# Patient Record
Sex: Female | Born: 1985
Health system: Southern US, Community
[De-identification: ages and names within clinical notes are randomized; demographics above are authoritative.]

## PROBLEM LIST (undated history)

## (undated) DIAGNOSIS — F419 Anxiety disorder, unspecified: Secondary | ICD-10-CM

## (undated) DIAGNOSIS — I10 Essential (primary) hypertension: Secondary | ICD-10-CM

## (undated) DIAGNOSIS — T8859XA Other complications of anesthesia, initial encounter: Secondary | ICD-10-CM

## (undated) HISTORY — DX: Other complications of anesthesia, initial encounter: T88.59XA

## (undated) HISTORY — DX: Essential (primary) hypertension: I10

## (undated) HISTORY — PX: TONSILLECTOMY: SUR1361

## (undated) HISTORY — DX: Anxiety disorder, unspecified: F41.9

---

## 2019-02-04 NOTE — L&D Delivery Note (Signed)
Delivery Note:   G2P1001 at [redacted]w[redacted]d  Admitting diagnosis: Encounter for induction of labor [Z34.90] Risks: CHTN Onset of labor: 1138am IOL/Augmentation: AROM and Pitocin ROM: at 1443 for clear fluid   Complete dilation at 10/27/2019  1600 Onset of pushing at 1600 FHR second stage Category   Analgesia /Anesthesia intrapartum:Epidural  Pushing in litotomy position with CNM and L&D staff support, FOB and mother present for birth and supportive.  Delivery of a Live born female  Birth Weight:  pending APGAR: 8, 9  Newborn Delivery   Birth date/time: 10/27/2019 16:08:00 Delivery type: Vaginal, Spontaneous      in cephalic presentation, position LOA to LOT.  APGAR:1 min-8 , 5 min-9  Nuchal Cord: No  Cord double clamped after cessation of pulsation, cut by FOB.  Collection of cord blood for typing completed. Cord blood donation-None  Arterial cord blood sample-No    Placenta delivered-Spontaneous  with 3 vessels . Uterotonics: Pitocin  Placenta to hospital disposal. Uterine tone firm bleeding stable  1st degree;Perineal  deep left sulcus, right vaginal laceration identified. 2.0 vicryl rapide used to repair deep sulcus laceration. Upon completion of repair, bleeding noted from under repair. Initial stitch removed. Deep interrupted stitch placed by Dr. Ernestina Penna, and then I repaired it in standard fashion. 1 additional deep interrupted stitch placed by me with good hemostasis. Right vaginal laceration repaired in standard fashion with 2.0 vicryl rapide. 1st degree perineal laceration repaired with 3.0 vicryl rapide with subcuticular closure.  Hemostasis was noted. No evidence of hematoma, but ecchymosis of vaginal floor noted.  Episiotomy:None  Local analgesia: 1% Lidocaine  Est. Blood Loss (mL):350.00   Complications: None   Mom to postpartum.  Baby to Couplet care / Skin to Skin Delivery Report:  Review the Delivery Report for details.     Signed: Karena Addison, CNM,  MSN 10/27/2019, 5:28 PM

## 2019-03-11 DIAGNOSIS — Z3689 Encounter for other specified antenatal screening: Secondary | ICD-10-CM | POA: Diagnosis not present

## 2019-03-11 DIAGNOSIS — Z3200 Encounter for pregnancy test, result unknown: Secondary | ICD-10-CM | POA: Diagnosis not present

## 2019-03-25 DIAGNOSIS — Z3201 Encounter for pregnancy test, result positive: Secondary | ICD-10-CM | POA: Diagnosis not present

## 2019-04-08 DIAGNOSIS — R339 Retention of urine, unspecified: Secondary | ICD-10-CM | POA: Diagnosis not present

## 2019-04-08 DIAGNOSIS — I10 Essential (primary) hypertension: Secondary | ICD-10-CM | POA: Diagnosis not present

## 2019-04-08 DIAGNOSIS — R300 Dysuria: Secondary | ICD-10-CM | POA: Diagnosis not present

## 2019-04-15 DIAGNOSIS — O169 Unspecified maternal hypertension, unspecified trimester: Secondary | ICD-10-CM | POA: Diagnosis not present

## 2019-04-15 DIAGNOSIS — Z36 Encounter for antenatal screening for chromosomal anomalies: Secondary | ICD-10-CM | POA: Diagnosis not present

## 2019-04-15 DIAGNOSIS — Z1151 Encounter for screening for human papillomavirus (HPV): Secondary | ICD-10-CM | POA: Diagnosis not present

## 2019-04-15 DIAGNOSIS — O99210 Obesity complicating pregnancy, unspecified trimester: Secondary | ICD-10-CM | POA: Diagnosis not present

## 2019-04-15 DIAGNOSIS — Z124 Encounter for screening for malignant neoplasm of cervix: Secondary | ICD-10-CM | POA: Diagnosis not present

## 2019-04-15 DIAGNOSIS — Z3A10 10 weeks gestation of pregnancy: Secondary | ICD-10-CM | POA: Diagnosis not present

## 2019-04-15 DIAGNOSIS — O09521 Supervision of elderly multigravida, first trimester: Secondary | ICD-10-CM | POA: Diagnosis not present

## 2019-04-15 LAB — OB RESULTS CONSOLE GC/CHLAMYDIA
Chlamydia: NEGATIVE
Gonorrhea: NEGATIVE

## 2019-04-15 LAB — OB RESULTS CONSOLE HEPATITIS B SURFACE ANTIGEN: Hepatitis B Surface Ag: NEGATIVE

## 2019-04-15 LAB — OB RESULTS CONSOLE ABO/RH: RH Type: POSITIVE

## 2019-04-15 LAB — OB RESULTS CONSOLE HIV ANTIBODY (ROUTINE TESTING): HIV: NONREACTIVE

## 2019-04-15 LAB — OB RESULTS CONSOLE RUBELLA ANTIBODY, IGM: Rubella: IMMUNE

## 2019-04-15 LAB — OB RESULTS CONSOLE ANTIBODY SCREEN: Antibody Screen: NEGATIVE

## 2019-04-15 LAB — OB RESULTS CONSOLE RPR: RPR: NONREACTIVE

## 2019-04-22 DIAGNOSIS — R339 Retention of urine, unspecified: Secondary | ICD-10-CM | POA: Diagnosis not present

## 2019-04-22 DIAGNOSIS — Z3A11 11 weeks gestation of pregnancy: Secondary | ICD-10-CM | POA: Diagnosis not present

## 2019-04-22 DIAGNOSIS — O09521 Supervision of elderly multigravida, first trimester: Secondary | ICD-10-CM | POA: Diagnosis not present

## 2019-05-16 DIAGNOSIS — Z361 Encounter for antenatal screening for raised alphafetoprotein level: Secondary | ICD-10-CM | POA: Diagnosis not present

## 2019-05-16 DIAGNOSIS — O162 Unspecified maternal hypertension, second trimester: Secondary | ICD-10-CM | POA: Diagnosis not present

## 2019-05-16 DIAGNOSIS — O169 Unspecified maternal hypertension, unspecified trimester: Secondary | ICD-10-CM | POA: Diagnosis not present

## 2019-05-16 DIAGNOSIS — O09522 Supervision of elderly multigravida, second trimester: Secondary | ICD-10-CM | POA: Diagnosis not present

## 2019-05-16 DIAGNOSIS — Z3A15 15 weeks gestation of pregnancy: Secondary | ICD-10-CM | POA: Diagnosis not present

## 2019-06-17 DIAGNOSIS — O10012 Pre-existing essential hypertension complicating pregnancy, second trimester: Secondary | ICD-10-CM | POA: Diagnosis not present

## 2019-06-17 DIAGNOSIS — Z3A19 19 weeks gestation of pregnancy: Secondary | ICD-10-CM | POA: Diagnosis not present

## 2019-07-21 DIAGNOSIS — O10012 Pre-existing essential hypertension complicating pregnancy, second trimester: Secondary | ICD-10-CM | POA: Diagnosis not present

## 2019-07-21 DIAGNOSIS — R233 Spontaneous ecchymoses: Secondary | ICD-10-CM | POA: Diagnosis not present

## 2019-07-21 DIAGNOSIS — O358XX0 Maternal care for other (suspected) fetal abnormality and damage, not applicable or unspecified: Secondary | ICD-10-CM | POA: Diagnosis not present

## 2019-07-21 DIAGNOSIS — O283 Abnormal ultrasonic finding on antenatal screening of mother: Secondary | ICD-10-CM | POA: Diagnosis not present

## 2019-07-21 DIAGNOSIS — Z3A24 24 weeks gestation of pregnancy: Secondary | ICD-10-CM | POA: Diagnosis not present

## 2019-07-21 DIAGNOSIS — T148XXA Other injury of unspecified body region, initial encounter: Secondary | ICD-10-CM | POA: Diagnosis not present

## 2019-07-26 ENCOUNTER — Ambulatory Visit (HOSPITAL_BASED_OUTPATIENT_CLINIC_OR_DEPARTMENT_OTHER): Payer: BLUE CROSS/BLUE SHIELD | Admitting: Maternal & Fetal Medicine

## 2019-07-26 ENCOUNTER — Other Ambulatory Visit: Payer: Self-pay

## 2019-07-26 ENCOUNTER — Other Ambulatory Visit: Payer: Self-pay | Admitting: Obstetrics

## 2019-07-26 ENCOUNTER — Ambulatory Visit: Payer: BLUE CROSS/BLUE SHIELD | Attending: Obstetrics

## 2019-07-26 ENCOUNTER — Encounter (INDEPENDENT_AMBULATORY_CARE_PROVIDER_SITE_OTHER): Payer: Self-pay

## 2019-07-26 ENCOUNTER — Ambulatory Visit: Payer: BLUE CROSS/BLUE SHIELD | Admitting: *Deleted

## 2019-07-26 VITALS — BP 133/85 | HR 73 | Ht 66.0 in

## 2019-07-26 DIAGNOSIS — Z3A25 25 weeks gestation of pregnancy: Secondary | ICD-10-CM

## 2019-07-26 DIAGNOSIS — O10913 Unspecified pre-existing hypertension complicating pregnancy, third trimester: Secondary | ICD-10-CM | POA: Diagnosis not present

## 2019-07-26 DIAGNOSIS — O98519 Other viral diseases complicating pregnancy, unspecified trimester: Secondary | ICD-10-CM | POA: Insufficient documentation

## 2019-07-26 DIAGNOSIS — Z363 Encounter for antenatal screening for malformations: Secondary | ICD-10-CM

## 2019-07-26 DIAGNOSIS — O98513 Other viral diseases complicating pregnancy, third trimester: Secondary | ICD-10-CM

## 2019-07-26 DIAGNOSIS — R768 Other specified abnormal immunological findings in serum: Secondary | ICD-10-CM

## 2019-07-26 DIAGNOSIS — O358XX0 Maternal care for other (suspected) fetal abnormality and damage, not applicable or unspecified: Secondary | ICD-10-CM | POA: Diagnosis not present

## 2019-07-26 DIAGNOSIS — R894 Abnormal immunological findings in specimens from other organs, systems and tissues: Secondary | ICD-10-CM | POA: Diagnosis not present

## 2019-07-26 DIAGNOSIS — O353XX0 Maternal care for (suspected) damage to fetus from viral disease in mother, not applicable or unspecified: Secondary | ICD-10-CM

## 2019-07-26 DIAGNOSIS — IMO0002 Reserved for concepts with insufficient information to code with codable children: Secondary | ICD-10-CM

## 2019-07-26 DIAGNOSIS — O321XX0 Maternal care for breech presentation, not applicable or unspecified: Secondary | ICD-10-CM

## 2019-07-26 DIAGNOSIS — O09522 Supervision of elderly multigravida, second trimester: Secondary | ICD-10-CM

## 2019-07-26 DIAGNOSIS — O283 Abnormal ultrasonic finding on antenatal screening of mother: Secondary | ICD-10-CM | POA: Insufficient documentation

## 2019-07-26 DIAGNOSIS — B259 Cytomegaloviral disease, unspecified: Secondary | ICD-10-CM

## 2019-07-26 NOTE — Progress Notes (Signed)
MFM Consultation  Date of Service: 07/26/19 Requesting Provider: Aloha Gell, MD Reason for request: Antenatal CMV  Margaret Ryan is a 34 yo G2P1 at 51 w 1 d who is here in consultation at the request of Dr. Pamala Hurry regarding positive CMV IgG and IgM with echogenic bowel.  Ms. Duffy notes that the pregnancy has overall been normal without signs or symptoms of preterm labor or preeclampsia. She had some early first trimester bleeding that resolved. She had a low risk panorama and AFP. She declined CF screening due to cost.  On an outside exam echogenic bowel was observed. In addition the CMV IgG was elevated to 5.4 with an IgM of 90, both are significantly elevated above normal.  She reports that she has a young son who goes to day care and a month ago had a minor cold with red cheeks.   Vitals with BMI 07/26/2019  Height 5\' 6"   Systolic 740  Diastolic 85  Pulse 73    Recent CBC was normal/.  OB History  Gravida Para Term Preterm AB Living  2 1 1     1   SAB TAB Ectopic Multiple Live Births               # Outcome Date GA Lbr Len/2nd Weight Sex Delivery Anes PTL Lv  2 Current           1 Term      Vag-Spont      Past Medical History:  Diagnosis Date  . Anxiety   . Complication of anesthesia    She thinks itching after general anesth w/tonsillectomy  . Hypertension    Past Surgical History:  Procedure Laterality Date  . TONSILLECTOMY     Family History  Problem Relation Age of Onset  . Cancer Father   . Hypertension Brother    Social History   Socioeconomic History  . Marital status: Married    Spouse name: Not on file  . Number of children: Not on file  . Years of education: Not on file  . Highest education level: Not on file  Occupational History  . Not on file  Tobacco Use  . Smoking status: Former Research scientist (life sciences)  . Smokeless tobacco: Never Used  Vaping Use  . Vaping Use: Never used  Substance and Sexual Activity  . Alcohol use: Not Currently     Comment: Occas  . Drug use: Never  . Sexual activity: Not on file  Other Topics Concern  . Not on file  Social History Narrative  . Not on file   Social Determinants of Health   Financial Resource Strain:   . Difficulty of Paying Living Expenses:   Food Insecurity:   . Worried About Charity fundraiser in the Last Year:   . Arboriculturist in the Last Year:   Transportation Needs:   . Film/video editor (Medical):   Marland Kitchen Lack of Transportation (Non-Medical):   Physical Activity:   . Days of Exercise per Week:   . Minutes of Exercise per Session:   Stress:   . Feeling of Stress :   Social Connections:   . Frequency of Communication with Friends and Family:   . Frequency of Social Gatherings with Friends and Family:   . Attends Religious Services:   . Active Member of Clubs or Organizations:   . Attends Archivist Meetings:   Marland Kitchen Marital Status:   Intimate Partner Violence:   . Fear of Current or Ex-Partner:   .  Emotionally Abused:   Marland Kitchen Physically Abused:   . Sexually Abused:    No Known Allergies   Imaging today revealed: Normal anatomy with echogenic bowel and significant number of liver calcifications. The fetus was normally growth with an EFW of 1lb 13 oz at the 57%. There was good amniotic fluid. No ventriculomegaly observed.  Impression/counseling:  I reviewed today's findings with Ms. Mcchristian and her husband. We discussed the diagnosis of echogenic bowel to include normal variant, aneuploidy, swallowed blood by the fetus, cystic fibrosis, infection and other genetic syndromes. We discussed the significance of the positive IgG and IgM, suggesting active CMV infection. However, we reiterated that the diagnostic test is an amniocentesis.   We discussed the evaluation and management of CMV and reviewed that there is no treatment and that the mainstay of management is amniocentesis and serial growth exam for fetal surveillance.  We discussed that the risk of  vertical transmission is 30-40% however, 2.5% have sensorineural loss and 15% may have CNS sequelae of various presentations. Ms. Malmquist likely contracting CMV in the mid-late second trimester. However, I explained that  even those that have positive PCR on amniocentesis may not be significantly effected by CMV. I reviewed that it is difficult to appreciate those numbers and overall risk until it directly affects you.  We discussed that serial growth exams are recommended to detect microcephaly, IUGR, oligohydramnios, ventriculomegaly, intracranial calcifications, ascites and possible hydrops.   An increase of ultrasound findings may indicate congenital infection in the absence PCR confirmed amniotic fluid.  At the conclusion of our meeting Mrs. And Mr. Cielo understood the increased risk for congenital CMV however, given that they are people of faith and that there is not treatment they declined an amniotcentesis and opted for serial growth exams.   All questions were answered. I provided my personal number if further questions need answering or further explanation.  I spent 45 minute with > 50% in face to face consultation and care coordination. I also discussed with Dr. Ernestina Penna.

## 2019-07-27 ENCOUNTER — Other Ambulatory Visit: Payer: Self-pay | Admitting: *Deleted

## 2019-07-27 DIAGNOSIS — O98519 Other viral diseases complicating pregnancy, unspecified trimester: Secondary | ICD-10-CM

## 2019-08-04 DIAGNOSIS — Z419 Encounter for procedure for purposes other than remedying health state, unspecified: Secondary | ICD-10-CM | POA: Diagnosis not present

## 2019-08-25 DIAGNOSIS — Z3689 Encounter for other specified antenatal screening: Secondary | ICD-10-CM | POA: Diagnosis not present

## 2019-08-25 DIAGNOSIS — Z3A29 29 weeks gestation of pregnancy: Secondary | ICD-10-CM | POA: Diagnosis not present

## 2019-08-25 DIAGNOSIS — O10012 Pre-existing essential hypertension complicating pregnancy, second trimester: Secondary | ICD-10-CM | POA: Diagnosis not present

## 2019-08-26 ENCOUNTER — Other Ambulatory Visit: Payer: Self-pay | Admitting: *Deleted

## 2019-08-26 ENCOUNTER — Other Ambulatory Visit: Payer: Self-pay

## 2019-08-26 ENCOUNTER — Ambulatory Visit: Payer: BLUE CROSS/BLUE SHIELD | Attending: Obstetrics and Gynecology

## 2019-08-26 ENCOUNTER — Ambulatory Visit: Payer: BLUE CROSS/BLUE SHIELD | Admitting: *Deleted

## 2019-08-26 VITALS — BP 124/69 | HR 81

## 2019-08-26 DIAGNOSIS — O09523 Supervision of elderly multigravida, third trimester: Secondary | ICD-10-CM | POA: Diagnosis not present

## 2019-08-26 DIAGNOSIS — O98513 Other viral diseases complicating pregnancy, third trimester: Secondary | ICD-10-CM | POA: Diagnosis not present

## 2019-08-26 DIAGNOSIS — Z362 Encounter for other antenatal screening follow-up: Secondary | ICD-10-CM | POA: Diagnosis not present

## 2019-08-26 DIAGNOSIS — B259 Cytomegaloviral disease, unspecified: Secondary | ICD-10-CM

## 2019-08-26 DIAGNOSIS — Z3A29 29 weeks gestation of pregnancy: Secondary | ICD-10-CM

## 2019-08-26 DIAGNOSIS — O10013 Pre-existing essential hypertension complicating pregnancy, third trimester: Secondary | ICD-10-CM

## 2019-08-26 DIAGNOSIS — O98519 Other viral diseases complicating pregnancy, unspecified trimester: Secondary | ICD-10-CM | POA: Insufficient documentation

## 2019-08-26 DIAGNOSIS — O0990 Supervision of high risk pregnancy, unspecified, unspecified trimester: Secondary | ICD-10-CM

## 2019-08-26 DIAGNOSIS — O10919 Unspecified pre-existing hypertension complicating pregnancy, unspecified trimester: Secondary | ICD-10-CM

## 2019-08-26 DIAGNOSIS — O359XX0 Maternal care for (suspected) fetal abnormality and damage, unspecified, not applicable or unspecified: Secondary | ICD-10-CM

## 2019-09-04 DIAGNOSIS — Z419 Encounter for procedure for purposes other than remedying health state, unspecified: Secondary | ICD-10-CM | POA: Diagnosis not present

## 2019-09-09 DIAGNOSIS — Z3A31 31 weeks gestation of pregnancy: Secondary | ICD-10-CM | POA: Diagnosis not present

## 2019-09-09 DIAGNOSIS — Z23 Encounter for immunization: Secondary | ICD-10-CM | POA: Diagnosis not present

## 2019-09-09 DIAGNOSIS — O10013 Pre-existing essential hypertension complicating pregnancy, third trimester: Secondary | ICD-10-CM | POA: Diagnosis not present

## 2019-09-16 DIAGNOSIS — Z3A32 32 weeks gestation of pregnancy: Secondary | ICD-10-CM | POA: Diagnosis not present

## 2019-09-16 DIAGNOSIS — O10013 Pre-existing essential hypertension complicating pregnancy, third trimester: Secondary | ICD-10-CM | POA: Diagnosis not present

## 2019-09-23 ENCOUNTER — Ambulatory Visit: Payer: BC Managed Care – PPO | Admitting: *Deleted

## 2019-09-23 ENCOUNTER — Ambulatory Visit: Payer: BC Managed Care – PPO | Attending: Obstetrics

## 2019-09-23 ENCOUNTER — Other Ambulatory Visit: Payer: Self-pay

## 2019-09-23 ENCOUNTER — Other Ambulatory Visit: Payer: Self-pay | Admitting: *Deleted

## 2019-09-23 VITALS — BP 122/83 | HR 85

## 2019-09-23 DIAGNOSIS — O98513 Other viral diseases complicating pregnancy, third trimester: Secondary | ICD-10-CM | POA: Diagnosis not present

## 2019-09-23 DIAGNOSIS — O359XX0 Maternal care for (suspected) fetal abnormality and damage, unspecified, not applicable or unspecified: Secondary | ICD-10-CM | POA: Diagnosis not present

## 2019-09-23 DIAGNOSIS — O10919 Unspecified pre-existing hypertension complicating pregnancy, unspecified trimester: Secondary | ICD-10-CM

## 2019-09-23 DIAGNOSIS — Z362 Encounter for other antenatal screening follow-up: Secondary | ICD-10-CM | POA: Diagnosis not present

## 2019-09-23 DIAGNOSIS — Z3A33 33 weeks gestation of pregnancy: Secondary | ICD-10-CM

## 2019-09-23 DIAGNOSIS — O10013 Pre-existing essential hypertension complicating pregnancy, third trimester: Secondary | ICD-10-CM

## 2019-09-23 DIAGNOSIS — O09523 Supervision of elderly multigravida, third trimester: Secondary | ICD-10-CM

## 2019-09-29 DIAGNOSIS — O133 Gestational [pregnancy-induced] hypertension without significant proteinuria, third trimester: Secondary | ICD-10-CM | POA: Diagnosis not present

## 2019-09-29 DIAGNOSIS — Z3A34 34 weeks gestation of pregnancy: Secondary | ICD-10-CM | POA: Diagnosis not present

## 2019-10-05 DIAGNOSIS — Z419 Encounter for procedure for purposes other than remedying health state, unspecified: Secondary | ICD-10-CM | POA: Diagnosis not present

## 2019-10-06 DIAGNOSIS — O09523 Supervision of elderly multigravida, third trimester: Secondary | ICD-10-CM | POA: Diagnosis not present

## 2019-10-06 DIAGNOSIS — Z3685 Encounter for antenatal screening for Streptococcus B: Secondary | ICD-10-CM | POA: Diagnosis not present

## 2019-10-06 DIAGNOSIS — Z3689 Encounter for other specified antenatal screening: Secondary | ICD-10-CM | POA: Diagnosis not present

## 2019-10-06 DIAGNOSIS — J069 Acute upper respiratory infection, unspecified: Secondary | ICD-10-CM | POA: Diagnosis not present

## 2019-10-06 DIAGNOSIS — Z7189 Other specified counseling: Secondary | ICD-10-CM | POA: Diagnosis not present

## 2019-10-06 DIAGNOSIS — O26893 Other specified pregnancy related conditions, third trimester: Secondary | ICD-10-CM | POA: Diagnosis not present

## 2019-10-06 DIAGNOSIS — O10013 Pre-existing essential hypertension complicating pregnancy, third trimester: Secondary | ICD-10-CM | POA: Diagnosis not present

## 2019-10-06 DIAGNOSIS — Z3A35 35 weeks gestation of pregnancy: Secondary | ICD-10-CM | POA: Diagnosis not present

## 2019-10-06 LAB — OB RESULTS CONSOLE GBS: GBS: NEGATIVE

## 2019-10-14 DIAGNOSIS — O10013 Pre-existing essential hypertension complicating pregnancy, third trimester: Secondary | ICD-10-CM | POA: Diagnosis not present

## 2019-10-14 DIAGNOSIS — Z7189 Other specified counseling: Secondary | ICD-10-CM | POA: Diagnosis not present

## 2019-10-14 DIAGNOSIS — Z3A36 36 weeks gestation of pregnancy: Secondary | ICD-10-CM | POA: Diagnosis not present

## 2019-10-20 ENCOUNTER — Encounter (HOSPITAL_COMMUNITY): Payer: Self-pay | Admitting: *Deleted

## 2019-10-20 ENCOUNTER — Telehealth (HOSPITAL_COMMUNITY): Payer: Self-pay | Admitting: *Deleted

## 2019-10-20 NOTE — Telephone Encounter (Signed)
Preadmission screen  

## 2019-10-21 ENCOUNTER — Other Ambulatory Visit: Payer: Self-pay | Admitting: Obstetrics

## 2019-10-21 ENCOUNTER — Ambulatory Visit: Payer: BC Managed Care – PPO | Attending: Obstetrics and Gynecology

## 2019-10-21 ENCOUNTER — Ambulatory Visit: Payer: BC Managed Care – PPO | Admitting: *Deleted

## 2019-10-21 ENCOUNTER — Encounter: Payer: Self-pay | Admitting: *Deleted

## 2019-10-21 ENCOUNTER — Other Ambulatory Visit: Payer: Self-pay

## 2019-10-21 VITALS — BP 125/85 | HR 85

## 2019-10-21 DIAGNOSIS — O359XX0 Maternal care for (suspected) fetal abnormality and damage, unspecified, not applicable or unspecified: Secondary | ICD-10-CM

## 2019-10-21 DIAGNOSIS — O10919 Unspecified pre-existing hypertension complicating pregnancy, unspecified trimester: Secondary | ICD-10-CM

## 2019-10-21 DIAGNOSIS — O09523 Supervision of elderly multigravida, third trimester: Secondary | ICD-10-CM | POA: Diagnosis not present

## 2019-10-21 DIAGNOSIS — Z3A37 37 weeks gestation of pregnancy: Secondary | ICD-10-CM | POA: Diagnosis not present

## 2019-10-21 DIAGNOSIS — Z362 Encounter for other antenatal screening follow-up: Secondary | ICD-10-CM

## 2019-10-21 DIAGNOSIS — O98513 Other viral diseases complicating pregnancy, third trimester: Secondary | ICD-10-CM

## 2019-10-21 DIAGNOSIS — O10013 Pre-existing essential hypertension complicating pregnancy, third trimester: Secondary | ICD-10-CM

## 2019-10-25 ENCOUNTER — Other Ambulatory Visit: Payer: Self-pay | Admitting: Obstetrics

## 2019-10-25 ENCOUNTER — Other Ambulatory Visit (HOSPITAL_COMMUNITY): Payer: BC Managed Care – PPO

## 2019-10-26 ENCOUNTER — Other Ambulatory Visit (HOSPITAL_COMMUNITY)
Admission: RE | Admit: 2019-10-26 | Discharge: 2019-10-26 | Disposition: A | Discharge status: Home / Self Care | Payer: BC Managed Care – PPO | Source: Ambulatory Visit | Attending: Obstetrics | Admitting: Obstetrics

## 2019-10-26 DIAGNOSIS — O164 Unspecified maternal hypertension, complicating childbirth: Secondary | ICD-10-CM | POA: Diagnosis not present

## 2019-10-26 DIAGNOSIS — Z20822 Contact with and (suspected) exposure to covid-19: Secondary | ICD-10-CM | POA: Diagnosis not present

## 2019-10-26 DIAGNOSIS — Z87891 Personal history of nicotine dependence: Secondary | ICD-10-CM | POA: Diagnosis not present

## 2019-10-26 DIAGNOSIS — Z3A38 38 weeks gestation of pregnancy: Secondary | ICD-10-CM | POA: Diagnosis not present

## 2019-10-26 DIAGNOSIS — Z01812 Encounter for preprocedural laboratory examination: Secondary | ICD-10-CM | POA: Insufficient documentation

## 2019-10-26 DIAGNOSIS — O1002 Pre-existing essential hypertension complicating childbirth: Secondary | ICD-10-CM | POA: Diagnosis not present

## 2019-10-26 LAB — SARS CORONAVIRUS 2 (TAT 6-24 HRS): SARS Coronavirus 2: NEGATIVE

## 2019-10-26 NOTE — H&P (Signed)
Margaret Ryan is a 34 y.o. G2P1001 at [redacted]w[redacted]d presenting for IOL due to gest htn. Pt notes rare contractions. Good fetal movement, No vaginal bleeding, not leaking fluid.  P[facility] at [facility] since 7 wks - Dated by LMP c/w 7 wk u/s - refuses Covid vaccine, does not want to take PP either, multiple discussions - chronic htn, well controlled on labetalol 100mg  tid, reassuring 3rd trimester fetal testing, never with concerns about PEC, plan IOL at 38 wks. Pt did baby ASA til 27 wks then stopped due to bruising - Suspect congenital CMV infection, pt w/o history of early viral exposure but anatomy scan with echogenic bowel and abdominal calcifications. CMV IgG and IgM positive, pt followed by MFM, declined amnio. Will alert peds to suspect and test for congenital CMV    Prenatal Transfer Tool  Maternal Diabetes: No Genetic Screening: Normal Maternal Ultrasounds/Referrals: Echogenic bowel Fetal Ultrasounds or other Referrals:  Referred to Materal Fetal Medicine  Maternal Substance Abuse:  No Significant Maternal Medications:  None Significant Maternal Lab Results: Group B Strep negative     OB History    Gravida  2   Para  1   Term  1   Preterm      AB      Living  1     SAB      TAB      Ectopic      Multiple      Live Births             Past Medical History:  Diagnosis Date  . Anxiety   . Complication of anesthesia    She thinks itching after general anesth w/tonsillectomy  . Hypertension    Past Surgical History:  Procedure Laterality Date  . TONSILLECTOMY     Family History: family history includes Cancer in her father; Hypertension in her brother. Social History:  reports that she has quit smoking. She has never used smokeless tobacco. She reports previous alcohol use. She reports that she does not use drugs.  Review of Systems - Negative except discomfort of pregnancy     PE Vitals:   10/27/19 0753  BP: 129/76  Pulse: 89  Resp: 18  Temp:  98.7 F (37.1 C)      Prenatal labs: ABO, Rh: O/Positive/-- (03/12 0000) Antibody: Negative (03/12 0000) Rubella: Immune (03/12 0000) RPR: Nonreactive (03/12 0000)  HBsAg: Negative (03/12 0000)  HIV: Non-reactive (03/12 0000)  GBS: Negative/-- (09/02 0000)  1 hr Glucola 122  Genetic screening normal Panorama, nl AFP Anatomy [birth year]1987 normal   Assessment/Plan: 34 y.o. G2P1001 at [redacted]w[redacted]d - chronic htn, IOL, watch bps, check labs to eval for PEC with elevated bp - Possible congenital CMV, alert peds - IOL, pit 2x2, AROM when able   [redacted]w[redacted]d 10/26/2019, 11:22 PM  10/28/2019 10/27/2019 8:05 AM

## 2019-10-27 ENCOUNTER — Inpatient Hospital Stay (HOSPITAL_COMMUNITY): Payer: BC Managed Care – PPO

## 2019-10-27 ENCOUNTER — Encounter (HOSPITAL_COMMUNITY): Payer: Self-pay | Admitting: Obstetrics

## 2019-10-27 ENCOUNTER — Inpatient Hospital Stay (HOSPITAL_COMMUNITY): Payer: BC Managed Care – PPO | Admitting: Anesthesiology

## 2019-10-27 ENCOUNTER — Inpatient Hospital Stay (HOSPITAL_COMMUNITY)
Admission: AD | Admit: 2019-10-27 | Discharge: 2019-10-28 | DRG: 807 | Disposition: A | Discharge status: Home / Self Care | Payer: BC Managed Care – PPO | Attending: Obstetrics | Admitting: Obstetrics

## 2019-10-27 ENCOUNTER — Other Ambulatory Visit: Payer: Self-pay

## 2019-10-27 DIAGNOSIS — O1002 Pre-existing essential hypertension complicating childbirth: Principal | ICD-10-CM | POA: Diagnosis present

## 2019-10-27 DIAGNOSIS — Z3490 Encounter for supervision of normal pregnancy, unspecified, unspecified trimester: Secondary | ICD-10-CM | POA: Diagnosis present

## 2019-10-27 DIAGNOSIS — Z87891 Personal history of nicotine dependence: Secondary | ICD-10-CM | POA: Diagnosis not present

## 2019-10-27 DIAGNOSIS — Z20822 Contact with and (suspected) exposure to covid-19: Secondary | ICD-10-CM | POA: Diagnosis present

## 2019-10-27 DIAGNOSIS — Z3A38 38 weeks gestation of pregnancy: Secondary | ICD-10-CM | POA: Diagnosis not present

## 2019-10-27 DIAGNOSIS — I10 Essential (primary) hypertension: Secondary | ICD-10-CM | POA: Diagnosis present

## 2019-10-27 DIAGNOSIS — O10919 Unspecified pre-existing hypertension complicating pregnancy, unspecified trimester: Secondary | ICD-10-CM | POA: Diagnosis present

## 2019-10-27 LAB — CBC
HCT: 39.3 % (ref 36.0–46.0)
Hemoglobin: 13.1 g/dL (ref 12.0–15.0)
MCH: 30.3 pg (ref 26.0–34.0)
MCHC: 33.3 g/dL (ref 30.0–36.0)
MCV: 91 fL (ref 80.0–100.0)
Platelets: 191 10*3/uL (ref 150–400)
RBC: 4.32 MIL/uL (ref 3.87–5.11)
RDW: 12.7 % (ref 11.5–15.5)
WBC: 7.5 10*3/uL (ref 4.0–10.5)
nRBC: 0 % (ref 0.0–0.2)

## 2019-10-27 LAB — TYPE AND SCREEN
ABO/RH(D): O POS
Antibody Screen: NEGATIVE

## 2019-10-27 LAB — RPR: RPR Ser Ql: NONREACTIVE

## 2019-10-27 MED ORDER — DIPHENHYDRAMINE HCL 50 MG/ML IJ SOLN: 12.5000 mg | INTRAMUSCULAR | Status: DC | PRN

## 2019-10-27 MED ORDER — DIPHENHYDRAMINE HCL 25 MG PO CAPS: 25.0000 mg | ORAL_CAPSULE | Freq: Four times a day (QID) | ORAL | Status: DC | PRN

## 2019-10-27 MED ORDER — SOD CITRATE-CITRIC ACID 500-334 MG/5ML PO SOLN: 30.0000 mL | ORAL | Status: DC | PRN

## 2019-10-27 MED ORDER — WITCH HAZEL-GLYCERIN EX PADS: 1.0000 "application " | MEDICATED_PAD | CUTANEOUS | Status: DC | PRN

## 2019-10-27 MED ORDER — OXYTOCIN BOLUS FROM INFUSION
333.0000 mL | Freq: Once | INTRAVENOUS | Status: AC
  Administered 2019-10-27: 333 mL via INTRAVENOUS

## 2019-10-27 MED ORDER — FENTANYL CITRATE (PF) 2500 MCG/50ML IJ SOLN
INTRAMUSCULAR | Status: DC | PRN
Start: 2019-10-27 — End: 2019-10-27
  Administered 2019-10-27: 12 mL/h via EPIDURAL

## 2019-10-27 MED ORDER — LIDOCAINE HCL (PF) 1 % IJ SOLN
INTRAMUSCULAR | Status: DC | PRN
  Administered 2019-10-27: 11 mL via EPIDURAL

## 2019-10-27 MED ORDER — SENNOSIDES-DOCUSATE SODIUM 8.6-50 MG PO TABS
2.0000 | ORAL_TABLET | ORAL | Status: DC
  Administered 2019-10-27: 2 via ORAL
  Filled 2019-10-27: qty 2

## 2019-10-27 MED ORDER — LIDOCAINE HCL (PF) 1 % IJ SOLN
30.0000 mL | INTRAMUSCULAR | Status: AC | PRN
  Administered 2019-10-27: 30 mL via SUBCUTANEOUS
  Filled 2019-10-27: qty 30

## 2019-10-27 MED ORDER — OXYTOCIN-SODIUM CHLORIDE 30-0.9 UT/500ML-% IV SOLN
2.5000 [IU]/h | INTRAVENOUS | Status: DC
  Administered 2019-10-27: 2.5 [IU]/h via INTRAVENOUS

## 2019-10-27 MED ORDER — OXYTOCIN-SODIUM CHLORIDE 30-0.9 UT/500ML-% IV SOLN
1.0000 m[IU]/min | INTRAVENOUS | Status: DC
  Administered 2019-10-27: 2 m[IU]/min via INTRAVENOUS
  Filled 2019-10-27: qty 500

## 2019-10-27 MED ORDER — PHENYLEPHRINE 40 MCG/ML (10ML) SYRINGE FOR IV PUSH (FOR BLOOD PRESSURE SUPPORT): 80.0000 ug | PREFILLED_SYRINGE | INTRAVENOUS | Status: DC | PRN

## 2019-10-27 MED ORDER — PHENYLEPHRINE 40 MCG/ML (10ML) SYRINGE FOR IV PUSH (FOR BLOOD PRESSURE SUPPORT)
80.0000 ug | PREFILLED_SYRINGE | INTRAVENOUS | Status: DC | PRN
  Filled 2019-10-27: qty 10

## 2019-10-27 MED ORDER — IBUPROFEN 600 MG PO TABS
600.0000 mg | ORAL_TABLET | Freq: Four times a day (QID) | ORAL | Status: DC
  Administered 2019-10-27 – 2019-10-28 (×4): 600 mg via ORAL
  Filled 2019-10-27 (×4): qty 1

## 2019-10-27 MED ORDER — SIMETHICONE 80 MG PO CHEW: 80.0000 mg | CHEWABLE_TABLET | ORAL | Status: DC | PRN

## 2019-10-27 MED ORDER — ACETAMINOPHEN 325 MG PO TABS
650.0000 mg | ORAL_TABLET | ORAL | Status: DC | PRN
  Administered 2019-10-28: 650 mg via ORAL
  Filled 2019-10-27: qty 2

## 2019-10-27 MED ORDER — BENZOCAINE-MENTHOL 20-0.5 % EX AERO
1.0000 "application " | INHALATION_SPRAY | CUTANEOUS | Status: DC | PRN
  Administered 2019-10-27: 1 via TOPICAL
  Filled 2019-10-27: qty 56

## 2019-10-27 MED ORDER — ONDANSETRON HCL 4 MG/2ML IJ SOLN: 4.0000 mg | INTRAMUSCULAR | Status: DC | PRN

## 2019-10-27 MED ORDER — ZOLPIDEM TARTRATE 5 MG PO TABS: 5.0000 mg | ORAL_TABLET | Freq: Every evening | ORAL | Status: DC | PRN

## 2019-10-27 MED ORDER — LACTATED RINGERS IV SOLN: 500.0000 mL | INTRAVENOUS | Status: DC | PRN

## 2019-10-27 MED ORDER — EPHEDRINE 5 MG/ML INJ: 10.0000 mg | INTRAVENOUS | Status: DC | PRN

## 2019-10-27 MED ORDER — TETANUS-DIPHTH-ACELL PERTUSSIS 5-2.5-18.5 LF-MCG/0.5 IM SUSP: 0.5000 mL | Freq: Once | INTRAMUSCULAR | Status: DC

## 2019-10-27 MED ORDER — PRENATAL MULTIVITAMIN CH
1.0000 | ORAL_TABLET | Freq: Every day | ORAL | Status: DC
  Administered 2019-10-28: 1 via ORAL
  Filled 2019-10-27: qty 1

## 2019-10-27 MED ORDER — LACTATED RINGERS IV SOLN: INTRAVENOUS | Status: DC

## 2019-10-27 MED ORDER — FENTANYL-BUPIVACAINE-NACL 0.5-0.125-0.9 MG/250ML-% EP SOLN
12.0000 mL/h | EPIDURAL | Status: DC | PRN
  Filled 2019-10-27: qty 250

## 2019-10-27 MED ORDER — LACTATED RINGERS IV SOLN: 500.0000 mL | Freq: Once | INTRAVENOUS | Status: DC

## 2019-10-27 MED ORDER — TERBUTALINE SULFATE 1 MG/ML IJ SOLN: 0.2500 mg | Freq: Once | INTRAMUSCULAR | Status: DC | PRN

## 2019-10-27 MED ORDER — DIBUCAINE (PERIANAL) 1 % EX OINT: 1.0000 "application " | TOPICAL_OINTMENT | CUTANEOUS | Status: DC | PRN

## 2019-10-27 MED ORDER — ONDANSETRON HCL 4 MG PO TABS: 4.0000 mg | ORAL_TABLET | ORAL | Status: DC | PRN

## 2019-10-27 MED ORDER — COCONUT OIL OIL: 1.0000 "application " | TOPICAL_OIL | Status: DC | PRN

## 2019-10-27 MED ORDER — ACETAMINOPHEN 325 MG PO TABS: 650.0000 mg | ORAL_TABLET | ORAL | Status: DC | PRN

## 2019-10-27 MED ORDER — ONDANSETRON HCL 4 MG/2ML IJ SOLN: 4.0000 mg | Freq: Four times a day (QID) | INTRAMUSCULAR | Status: DC | PRN

## 2019-10-27 NOTE — Progress Notes (Signed)
I was asked by Dr. Ernestina Penna to assess VE and rupture of membranes S:  Comfortable with epidural; discussed AROM and patient agrees  O: Pitocin at 8 milliunits    VS: Blood pressure 128/79, pulse 67, temperature 98.5 F (36.9 C), temperature source Oral, resp. rate 18, height 5\' 6"  (1.676 m), weight 111.8 kg, last menstrual period 01/31/2019, SpO2 99 %.        FHR : baseline 120 bpm / variability moderate / accelerations +15x15 / occasional variable decelerations        Toco: contractions every 2-3 minutes / moderate         Cervix : 5cm/60/-2        Membranes: AROM for clear fluid  A: Latent labor     FHR category 2     GBS negative  P: Anticipate NSVD. Dr. 02/02/2019 to assume care.     Ernestina Penna, MSN, CNM [facility]

## 2019-10-27 NOTE — Anesthesia Procedure Notes (Signed)
Epidural Patient location during procedure: OB Start time: 10/27/2019 11:13 AM End time: 10/27/2019 11:20 AM  Staffing Anesthesiologist: Lowella Curb, MD Performed: other anesthesia staff   Preanesthetic Checklist Completed: patient identified, IV checked, site marked, risks and benefits discussed, surgical consent, monitors and equipment checked, pre-op evaluation and timeout performed  Epidural Patient position: sitting Prep: ChloraPrep Patient monitoring: heart rate, cardiac monitor, continuous pulse ox and blood pressure Approach: midline Location: L2-L3 Injection technique: LOR saline  Needle:  Needle type: Tuohy  Needle gauge: 17 G Needle length: 9 cm Needle insertion depth: 8 cm Catheter type: closed end flexible Catheter size: 20 Guage Catheter at skin depth: 12 cm Test dose: negative  Assessment Events: blood not aspirated, injection not painful, no injection resistance, no paresthesia and negative IV test  Additional Notes Epidural placed by SRNA under direct supervisionReason for block:procedure for pain

## 2019-10-27 NOTE — Anesthesia Preprocedure Evaluation (Signed)
Anesthesia Evaluation  Patient identified by MRN, date of birth, ID band Patient awake    Reviewed: Allergy & Precautions, NPO status , Patient's Chart, lab work & pertinent test results  Airway Mallampati: II  TM Distance: >3 FB Neck ROM: Full    Dental no notable dental hx.    Pulmonary neg pulmonary ROS, former smoker,    Pulmonary exam normal breath sounds clear to auscultation       Cardiovascular hypertension, negative cardio ROS Normal cardiovascular exam Rhythm:Regular Rate:Normal     Neuro/Psych negative neurological ROS  negative psych ROS   GI/Hepatic negative GI ROS, Neg liver ROS,   Endo/Other  negative endocrine ROS  Renal/GU negative Renal ROS  negative genitourinary   Musculoskeletal negative musculoskeletal ROS (+)   Abdominal (+) + obese,   Peds negative pediatric ROS (+)  Hematology negative hematology ROS (+)   Anesthesia Other Findings   Reproductive/Obstetrics (+) Pregnancy                             Anesthesia Physical Anesthesia Plan  ASA: II  Anesthesia Plan: Epidural   Post-op Pain Management:    Induction:   PONV Risk Score and Plan:   Airway Management Planned:   Additional Equipment:   Intra-op Plan:   Post-operative Plan:   Informed Consent:   Plan Discussed with:   Anesthesia Plan Comments:         Anesthesia Quick Evaluation

## 2019-10-28 LAB — CBC
HCT: 34.1 % — ABNORMAL LOW (ref 36.0–46.0)
Hemoglobin: 11.4 g/dL — ABNORMAL LOW (ref 12.0–15.0)
MCH: 30.5 pg (ref 26.0–34.0)
MCHC: 33.4 g/dL (ref 30.0–36.0)
MCV: 91.2 fL (ref 80.0–100.0)
Platelets: 156 10*3/uL (ref 150–400)
RBC: 3.74 MIL/uL — ABNORMAL LOW (ref 3.87–5.11)
RDW: 13 % (ref 11.5–15.5)
WBC: 8.9 10*3/uL (ref 4.0–10.5)
nRBC: 0 % (ref 0.0–0.2)

## 2019-10-28 MED ORDER — IBUPROFEN 600 MG PO TABS
600.0000 mg | ORAL_TABLET | Freq: Four times a day (QID) | ORAL | 0 refills | Status: DC
Start: 2019-10-28 — End: 2021-08-01

## 2019-10-28 MED ORDER — BENZOCAINE-MENTHOL 20-0.5 % EX AERO: 1.0000 "application " | INHALATION_SPRAY | CUTANEOUS | Status: DC | PRN

## 2019-10-28 MED ORDER — ACETAMINOPHEN 500 MG PO TABS: 1000.0000 mg | ORAL_TABLET | Freq: Four times a day (QID) | ORAL | 2 refills | Status: AC | PRN

## 2019-10-28 MED ORDER — COCONUT OIL OIL: 1.0000 "application " | TOPICAL_OIL | 0 refills | Status: DC | PRN

## 2019-10-28 NOTE — Progress Notes (Signed)
CSW received consult for hx of Anxiety . CSW met with MOB to offer support and complete assessment.    CSW congratulated MOB on the birth of infant. CSW advised MOB of the HIPPA policy as CSW noted that MOB had a guest in the room, in which MOB identified as her mom. CSW understanding and proceeded with assessing MOB. MOB expressed that she was never giving a clinical diagnosis of anxiety but does report "it normal anxiousness". CSW validated that as MOB went on to say other things about stress and anxiousness especially during this time. CSW asked MOB about medication use in which MOB expressed that she has never been on medication nor in therapy for her anxiety. MOB expressed that she prays. CSW understating and praised Mob for having a faith based outlet. MOB expressed no other mental health hx and denies SI and HI to this CSW.   MOB expressed that her supports are her parents. MOB reported that she has all needed items to care for infant with no other needs noted at this time   CSW provided education regarding the baby blues period vs. perinatal mood disorders, discussed treatment and gave resources for mental health follow up if concerns arise.  CSW recommends self-evaluation during the postpartum time period using the New Mom Checklist from Postpartum Progress and encouraged MOB to contact a medical professional if symptoms are noted at any time.   CSW provided review of Sudden Infant Death Syndrome (SIDS) precautions.  MOB expressed that she has basinet for infant to sleep in once arrived home.   CSW identifies no further need for intervention and no barriers to discharge at this time.  Zaedyn Covin S. Arihana Ambrocio, MSW, LCSW [facility] [phone]       

## 2019-10-28 NOTE — Discharge Summary (Signed)
OB Discharge Summary  Patient Name: Margaret Ryan DOB: 08-24-1985 MRN: [id number]  Date of admission: 10/27/2019 Delivering provider: Carlean Jews C   Admitting diagnosis: Encounter for induction of labor [Z34.90] Intrauterine pregnancy: [redacted]w[redacted]d     Secondary diagnosis: Patient Active Problem List   Diagnosis Date Noted  . Encounter for induction of labor 10/27/2019  . SVD (spontaneous vaginal delivery) 10/27/2019  . Postpartum care following vaginal delivery (9/23) 10/27/2019  . Obstetrical laceration: deep left sulcus, right vaginal 10/27/2019  . First degree perineal laceration 10/27/2019  . Chronic hypertension 10/27/2019   Additional problems:none   Date of discharge: 10/28/2019   Discharge diagnosis: Principal Problem:   Postpartum care following vaginal delivery (9/23) Active Problems:   Encounter for induction of labor   SVD (spontaneous vaginal delivery)   Obstetrical laceration: deep left sulcus, right vaginal   First degree perineal laceration   Chronic hypertension                                                              Post partum procedures:none  Augmentation: AROM Pain control: Epidural  Laceration:1st degree;Perineal  Episiotomy:None  Complications: None  Hospital course:  Induction of Labor With Vaginal Delivery   34 y.o. yo [id number] at [redacted]w[redacted]d was admitted to the hospital 10/27/2019 for induction of labor.  Indication for induction: Gestational hypertension.  Patient had an uncomplicated labor course as follows: Membrane Rupture Time/Date: 2:43 PM ,10/27/2019   Delivery Method:Vaginal, Spontaneous  Episiotomy: None  Lacerations:  1st degree;Perineal  Details of delivery can be found in separate delivery note.  Patient had a routine postpartum course. Patient is discharged home 10/28/19.  Newborn Data: Birth date:10/27/2019  Birth time:4:08 PM  Gender:Female  Living status:Living  Apgars:8 ,9  Weight:3204 g   Physical exam  Vitals:   10/27/19  1935 10/27/19 2342 10/28/19 0409 10/28/19 0800  BP: 129/76 112/72 120/77 133/75  Pulse: 71 75 77 71  Resp: 16 18 18 18   Temp: 98.3 F (36.8 C) 98.4 F (36.9 C) 98.1 F (36.7 C) 97.8 F (36.6 C)  TempSrc: Oral Oral Oral Oral  SpO2: 98% 98% 97%   Weight:      Height:       General: alert, cooperative and no distress Lochia: appropriate Uterine Fundus: firm Incision: N/A Perineum: repair intact, no edema DVT Evaluation: No cords or calf tenderness. No significant calf/ankle edema. Labs: Lab Results  Component Value Date   WBC 8.9 10/28/2019   HGB 11.4 (L) 10/28/2019   HCT 34.1 (L) 10/28/2019   MCV 91.2 10/28/2019   PLT 156 10/28/2019   No flowsheet data found. Edinburgh Postnatal Depression Scale Screening Tool 10/28/2019  I have been able to laugh and see the funny side of things. 0  I have looked forward with enjoyment to things. 0  I have blamed myself unnecessarily when things went wrong. 1  I have been anxious or worried for no good reason. 0  I have felt scared or panicky for no good reason. 0  Things have been getting on top of me. 0  I have been so unhappy that I have had difficulty sleeping. 0  I have felt sad or miserable. 0  I have been so unhappy that I have been crying. 0  The thought of harming  myself has occurred to me. 0  Edinburgh Postnatal Depression Scale Total 1   Vaccines: TDaP UTD         Flu and COVID19 declined  Discharge instruction:  per After Visit Summary,  [facility] booklet and  "Understanding Mother & Baby Care" hospital booklet  After Visit Meds:  Allergies as of 10/28/2019   No Known Allergies     Medication List    STOP taking these medications   diphenhydramine-acetaminophen 25-500 MG Tabs tablet Commonly known as: TYLENOL PM   labetalol 100 MG tablet Commonly known as: NORMODYNE     TAKE these medications   acetaminophen 500 MG tablet Commonly known as: TYLENOL Take 2 tablets (1,000 mg total) by mouth every 6 (six)  hours as needed. What changed:   how much to take  when to take this  reasons to take this   benzocaine-Menthol 20-0.5 % Aero Commonly known as: DERMOPLAST Apply 1 application topically as needed for irritation (perineal discomfort).   coconut oil Oil Apply 1 application topically as needed.   fluticasone 50 MCG/ACT nasal spray Commonly known as: FLONASE Place 1-2 sprays into both nostrils daily as needed for allergies or rhinitis.   ibuprofen 600 MG tablet Commonly known as: ADVIL Take 1 tablet (600 mg total) by mouth every 6 (six) hours.   MAGNESIUM MALATE PO Take 1 tablet by mouth daily.   PRENATAL VITAMIN PO Take 1 tablet by mouth daily.   VITAMIN C PO Take 1 tablet by mouth daily.   ZINC PO Take 1 tablet by mouth daily.            Discharge Care Instructions  (From admission, onward)         Start     Ordered   10/28/19 0000  Discharge wound care:       Comments: Sitz baths 2 times /day with warm water x 1 week. May add herbals: 1 ounce dried comfrey leaf* 1 ounce calendula flowers 1 ounce lavender flowers  Supplies can be found online at [employer] sources at [employer], [employer]  1/2 ounce dried uva ursi leaves 1/2 ounce witch hazel blossoms (if you can find them) 1/2 ounce dried sage leaf 1/2 cup sea salt Directions: Bring 2 quarts of water to a boil. Turn off heat, and place 1 ounce (approximately 1 large handful) of the above mixed herbs (not the salt) into the pot. Steep, covered, for 30 minutes.  Strain the liquid well with a fine mesh strainer, and discard the herb material. Add 2 quarts of liquid to the tub, along with the 1/2 cup of salt. This medicinal liquid can also be made into compresses and peri-rinses.   10/28/19 1146          Diet: routine diet  Activity: Advance as tolerated. Pelvic rest for 6 weeks.   Postpartum contraception: Not Discussed  Newborn Data: Live born female  Birth Weight: 7 lb 1 oz  (3204 g) APGAR: 8, 9  Newborn Delivery   Birth date/time: 10/27/2019 16:08:00 Delivery type: Vaginal, Spontaneous      named Landyn Baby Feeding: Breast Disposition:home with mother   Delivery Report:  Review the Delivery Report for details.    Follow up:  Follow-up Information    Noland Fordyce, MD. Schedule an appointment as soon as possible for a visit in 1 week(s).   Specialty: Obstetrics and Gynecology Why: BP check Contact information: [address] [place] [address] [phone]  Obgyn, [facility]. Call.   Why: Call the answering service over the weekend if blood pressure > 140/90 on 2 consecutive readings 15 min apart.  Contact information: [address] [place] [address] [phone]                 Signed: Cipriano Mile, MSN 10/28/2019, 11:51 AM

## 2019-10-28 NOTE — Lactation Note (Signed)
This note was copied from a baby's chart. Lactation Consultation Note  Patient Name: Margaret Ryan OZHYQ'M Date: 10/28/2019 Reason for consult: Follow-up assessment;Early term 37-38.6wks;Infant weight loss;Other (Comment) (1 %) Baby is 19 hours old  Post circ and has not fed since 0550 this am according to mom and the doc flow sheets. Mom mentioned baby has had a very busy am, bath and circ . Mom has been able to pump off a good amount of colostrum.  LC offered to assist to latch and mom receptive, after  Several attempts baby latched with depth and with swallows and fed approx 7 mins and became non - nutritive and LC showed mom how to release him. LC recommended since baby is sluggish from circ and had not fed since 0550 he needed a boost of calories.  LC finger fed baby 6 ml of EBM with mom holding the baby,.  Mom has been using the hand pump and still has EBM to use.  Mom denies soreness ,  sore nipple and engorgement prevention and tx reviewed. Mom mentioned she pumped with her 1st baby due to latching difficulties and expressed she was considering doing the same, but probably is going to work on the breast feeding.  LC reassured mom due to the circ today , it may be an off day and not  an indication of how the baby is going to do breast feeding.  Per mom will have a DEBP at home.  Mom  has the [facility] pamphlet and LC recommended coming back for [facility] consult O/P .   Maternal Data Has patient been taught Hand Expression?: Yes  Feeding Feeding Type:  (enc to feed)  LATCH Score                   Interventions Interventions: Breast feeding basics reviewed  Lactation Tools Discussed/Used     Consult Status Consult Status: Follow-up Date: 10/28/19 Follow-up type: In-patient    [name] 10/28/2019, 11:48 AM

## 2019-10-28 NOTE — Progress Notes (Signed)
CSW acknowledges consult for a hx of anxiety. CSW went to speak with MOB at bedside to address  further needs, however MOB asked that CSW return as "he just went to get circumcised and we are trying to rest, could you come back later?". CSW understanding and reported that CSW would return at a later time.     Claude Manges Rabecka Brendel, MSW, LCSW [facility] [place] [phone]

## 2019-10-28 NOTE — Anesthesia Postprocedure Evaluation (Signed)
Anesthesia Post Note  Patient: Ship broker  Procedure(s) Performed: AN AD HOC LABOR EPIDURAL     Patient location during evaluation: Mother Baby Anesthesia Type: Epidural Level of consciousness: awake and alert and oriented Pain management: satisfactory to patient Vital Signs Assessment: post-procedure vital signs reviewed and stable Respiratory status: respiratory function stable Cardiovascular status: stable Postop Assessment: no headache, no backache, epidural receding, patient able to bend at knees, no signs of nausea or vomiting, adequate PO intake and able to ambulate Anesthetic complications: no   No complications documented.  Last Vitals:  Vitals:   10/27/19 2342 10/28/19 0409  BP: 112/72 120/77  Pulse: 75 77  Resp: 18 18  Temp: 36.9 C 36.7 C  SpO2: 98% 97%    Last Pain:  Vitals:   10/28/19 0610  TempSrc:   PainSc: 3    Pain Goal:                   Karleen Dolphin

## 2019-11-01 DIAGNOSIS — O135 Gestational [pregnancy-induced] hypertension without significant proteinuria, complicating the puerperium: Secondary | ICD-10-CM | POA: Diagnosis not present

## 2019-11-01 DIAGNOSIS — O165 Unspecified maternal hypertension, complicating the puerperium: Secondary | ICD-10-CM | POA: Diagnosis not present

## 2019-11-04 DIAGNOSIS — Z419 Encounter for procedure for purposes other than remedying health state, unspecified: Secondary | ICD-10-CM | POA: Diagnosis not present

## 2019-11-10 DIAGNOSIS — O135 Gestational [pregnancy-induced] hypertension without significant proteinuria, complicating the puerperium: Secondary | ICD-10-CM | POA: Diagnosis not present

## 2019-12-05 DIAGNOSIS — Z419 Encounter for procedure for purposes other than remedying health state, unspecified: Secondary | ICD-10-CM | POA: Diagnosis not present

## 2019-12-06 DIAGNOSIS — R300 Dysuria: Secondary | ICD-10-CM | POA: Diagnosis not present

## 2019-12-06 DIAGNOSIS — Z1151 Encounter for screening for human papillomavirus (HPV): Secondary | ICD-10-CM | POA: Diagnosis not present

## 2020-01-04 DIAGNOSIS — Z419 Encounter for procedure for purposes other than remedying health state, unspecified: Secondary | ICD-10-CM | POA: Diagnosis not present

## 2020-02-04 DIAGNOSIS — Z419 Encounter for procedure for purposes other than remedying health state, unspecified: Secondary | ICD-10-CM | POA: Diagnosis not present

## 2020-03-06 DIAGNOSIS — Z419 Encounter for procedure for purposes other than remedying health state, unspecified: Secondary | ICD-10-CM | POA: Diagnosis not present

## 2020-04-03 DIAGNOSIS — Z419 Encounter for procedure for purposes other than remedying health state, unspecified: Secondary | ICD-10-CM | POA: Diagnosis not present

## 2020-05-04 DIAGNOSIS — Z419 Encounter for procedure for purposes other than remedying health state, unspecified: Secondary | ICD-10-CM | POA: Diagnosis not present

## 2020-06-03 DIAGNOSIS — Z419 Encounter for procedure for purposes other than remedying health state, unspecified: Secondary | ICD-10-CM | POA: Diagnosis not present

## 2020-07-04 DIAGNOSIS — Z419 Encounter for procedure for purposes other than remedying health state, unspecified: Secondary | ICD-10-CM | POA: Diagnosis not present

## 2020-08-03 DIAGNOSIS — Z419 Encounter for procedure for purposes other than remedying health state, unspecified: Secondary | ICD-10-CM | POA: Diagnosis not present

## 2020-09-03 DIAGNOSIS — Z419 Encounter for procedure for purposes other than remedying health state, unspecified: Secondary | ICD-10-CM | POA: Diagnosis not present

## 2020-09-13 DIAGNOSIS — M7662 Achilles tendinitis, left leg: Secondary | ICD-10-CM | POA: Diagnosis not present

## 2020-10-04 DIAGNOSIS — Z419 Encounter for procedure for purposes other than remedying health state, unspecified: Secondary | ICD-10-CM | POA: Diagnosis not present

## 2020-11-03 DIAGNOSIS — Z419 Encounter for procedure for purposes other than remedying health state, unspecified: Secondary | ICD-10-CM | POA: Diagnosis not present

## 2020-12-04 DIAGNOSIS — Z419 Encounter for procedure for purposes other than remedying health state, unspecified: Secondary | ICD-10-CM | POA: Diagnosis not present

## 2020-12-15 DIAGNOSIS — Z975 Presence of (intrauterine) contraceptive device: Secondary | ICD-10-CM | POA: Diagnosis not present

## 2020-12-15 DIAGNOSIS — N390 Urinary tract infection, site not specified: Secondary | ICD-10-CM | POA: Diagnosis not present

## 2020-12-15 DIAGNOSIS — R109 Unspecified abdominal pain: Secondary | ICD-10-CM | POA: Diagnosis not present

## 2020-12-15 DIAGNOSIS — Z79899 Other long term (current) drug therapy: Secondary | ICD-10-CM | POA: Diagnosis not present

## 2021-01-03 DIAGNOSIS — Z419 Encounter for procedure for purposes other than remedying health state, unspecified: Secondary | ICD-10-CM | POA: Diagnosis not present

## 2021-01-22 DIAGNOSIS — J029 Acute pharyngitis, unspecified: Secondary | ICD-10-CM | POA: Diagnosis not present

## 2021-01-22 DIAGNOSIS — Z20822 Contact with and (suspected) exposure to covid-19: Secondary | ICD-10-CM | POA: Diagnosis not present

## 2021-01-22 DIAGNOSIS — J111 Influenza due to unidentified influenza virus with other respiratory manifestations: Secondary | ICD-10-CM | POA: Diagnosis not present

## 2021-02-03 DIAGNOSIS — Z419 Encounter for procedure for purposes other than remedying health state, unspecified: Secondary | ICD-10-CM | POA: Diagnosis not present

## 2021-03-06 DIAGNOSIS — Z419 Encounter for procedure for purposes other than remedying health state, unspecified: Secondary | ICD-10-CM | POA: Diagnosis not present

## 2021-04-03 DIAGNOSIS — Z419 Encounter for procedure for purposes other than remedying health state, unspecified: Secondary | ICD-10-CM | POA: Diagnosis not present

## 2021-05-04 DIAGNOSIS — Z419 Encounter for procedure for purposes other than remedying health state, unspecified: Secondary | ICD-10-CM | POA: Diagnosis not present

## 2021-06-03 DIAGNOSIS — Z419 Encounter for procedure for purposes other than remedying health state, unspecified: Secondary | ICD-10-CM | POA: Diagnosis not present

## 2021-06-21 ENCOUNTER — Encounter: Payer: Self-pay | Admitting: General Practice

## 2021-07-04 DIAGNOSIS — Z419 Encounter for procedure for purposes other than remedying health state, unspecified: Secondary | ICD-10-CM | POA: Diagnosis not present

## 2021-08-01 ENCOUNTER — Encounter: Payer: Self-pay | Admitting: Family Medicine

## 2021-08-01 ENCOUNTER — Ambulatory Visit (INDEPENDENT_AMBULATORY_CARE_PROVIDER_SITE_OTHER): Payer: Medicaid Other | Admitting: Family Medicine

## 2021-08-01 ENCOUNTER — Other Ambulatory Visit (HOSPITAL_COMMUNITY)
Admission: RE | Admit: 2021-08-01 | Discharge: 2021-08-01 | Disposition: A | Discharge status: Home / Self Care | Payer: Medicaid Other | Source: Ambulatory Visit | Attending: Family Medicine | Admitting: Family Medicine

## 2021-08-01 ENCOUNTER — Encounter: Payer: Self-pay | Admitting: General Practice

## 2021-08-01 VITALS — BP 132/76 | HR 74 | Wt 262.0 lb

## 2021-08-01 DIAGNOSIS — Z3481 Encounter for supervision of other normal pregnancy, first trimester: Secondary | ICD-10-CM | POA: Diagnosis not present

## 2021-08-01 DIAGNOSIS — O10011 Pre-existing essential hypertension complicating pregnancy, first trimester: Secondary | ICD-10-CM | POA: Diagnosis not present

## 2021-08-01 DIAGNOSIS — Z3A11 11 weeks gestation of pregnancy: Secondary | ICD-10-CM

## 2021-08-01 DIAGNOSIS — O0990 Supervision of high risk pregnancy, unspecified, unspecified trimester: Secondary | ICD-10-CM | POA: Insufficient documentation

## 2021-08-01 DIAGNOSIS — Z3480 Encounter for supervision of other normal pregnancy, unspecified trimester: Secondary | ICD-10-CM | POA: Insufficient documentation

## 2021-08-01 DIAGNOSIS — Z3A10 10 weeks gestation of pregnancy: Secondary | ICD-10-CM

## 2021-08-01 DIAGNOSIS — Z6841 Body Mass Index (BMI) 40.0 and over, adult: Secondary | ICD-10-CM | POA: Diagnosis not present

## 2021-08-01 DIAGNOSIS — I10 Essential (primary) hypertension: Secondary | ICD-10-CM

## 2021-08-01 MED ORDER — PROMETHAZINE HCL 25 MG PO TABS: 25.0000 mg | ORAL_TABLET | Freq: Four times a day (QID) | ORAL | 1 refills | Status: DC | PRN

## 2021-08-01 MED ORDER — DOXYLAMINE-PYRIDOXINE 10-10 MG PO TBEC: 2.0000 | DELAYED_RELEASE_TABLET | Freq: Every day | ORAL | 5 refills | Status: DC

## 2021-08-01 NOTE — Progress Notes (Signed)
Subjective:  Benedetta Sundstrom is a [id number] 60w1dby [place] done by me, being seen today for her first obstetrical visit.  She has had 2 prior vaginal deliveries. Obstetrical risk factors is AMA. Medical risk factors is CHTN and BMI 40. Patient does intend to breast feed. Pregnancy history fully reviewed.  Patient reports nausea.  BP 132/76   Pulse 74   Wt 262 lb (118.8 kg)   LMP 05/11/2021   BMI 42.29 kg/m   HISTORY: OB History  Gravida Para Term Preterm AB Living  '3 2 2     2  ' SAB IAB Ectopic Multiple Live Births        0 1    # Outcome Date GA Lbr Len/2nd Weight Sex Delivery Anes PTL Lv  3 Current           2 Term 10/27/19 328w3d4:22 / 00:08 7 lb 1 oz (3.204 kg) M Vag-Spont EPI  LIV  1 Term      Vag-Spont       Past Medical History:  Diagnosis Date   Anxiety    Complication of anesthesia    She thinks itching after general anesth w/tonsillectomy   Hypertension     Past Surgical History:  Procedure Laterality Date   TONSILLECTOMY      Family History  Problem Relation Age of Onset   Cancer Father    Hypertension Brother      Exam  BP 132/76   Pulse 74   Wt 262 lb (118.8 kg)   LMP 05/11/2021   BMI 42.29 kg/m   Chaperone present during exam  CONSTITUTIONAL: Well-developed, well-nourished female in no acute distress.  HENT:  Normocephalic, atraumatic, External right and left ear normal. Oropharynx is clear and moist EYES: Conjunctivae and EOM are normal. Pupils are equal, round, and reactive to light. No scleral icterus.  NECK: Normal range of motion, supple, no masses.  Normal thyroid.  CARDIOVASCULAR: Normal heart rate noted, regular rhythm RESPIRATORY: Clear to auscultation bilaterally. Effort and breath sounds normal, no problems with respiration noted. BREASTS: Symmetric in size. No masses, skin changes, nipple drainage, or lymphadenopathy. ABDOMEN: Soft, normal bowel sounds, no distention noted.  No tenderness, rebound or guarding.  PELVIC:  Normal appearing external genitalia; normal appearing vaginal mucosa and cervix. No abnormal discharge noted. Normal uterine size, no other palpable masses, no uterine or adnexal tenderness. MUSCULOSKELETAL: Normal range of motion. No tenderness.  No cyanosis, clubbing, or edema.  2+ distal pulses. SKIN: Skin is warm and dry. No rash noted. Not diaphoretic. No erythema. No pallor. NEUROLOGIC: Alert and oriented to person, place, and time. Normal reflexes, muscle tone coordination. No cranial nerve deficit noted. PSYCHIATRIC: Normal mood and affect. Normal behavior. Normal judgment and thought content.    Assessment:    Pregnancy: [id number] Active Problem List   Diagnosis Date Noted   Supervision of other normal pregnancy, antepartum 08/01/2021   Chronic hypertension 10/27/2019      Plan:   1. Supervision of other normal pregnancy, antepartum  2. Chronic hypertension BP controlled currently. Will watch closely. Check CMP, CBC and urine protein:creatinine.  Start ASA 8128mt 12 weeks. - CBC/D/Plt+RPR+Rh+ABO+RubIgG... - Comp Met (CMET) - Protein / creatinine ratio, urine  3. BMI 40.0-44.9, adult (HCC) Check HgA1c Will need early GDM screening - Comp Met (CMET) - Protein / creatinine ratio, urine - Hemoglobin A1c  4. Encounter for supervision of other normal pregnancy in first trimester - Panorama Prenatal Test Full Panel -  Cytology - PAP( [place])     Initial labs obtained Continue prenatal vitamins Reviewed n/v relief measures and warning s/s to report Reviewed recommended weight gain based on pre-gravid BMI Encouraged well-balanced diet Genetic & carrier screening discussed: requests Panorama,  Ultrasound discussed; fetal survey: requested [place] completed> form faxed if has or is planning to apply for medicaid The nature of [place] for [employer] with multiple MDs and other Advanced Practice Providers was explained to patient; also  emphasized that fellows, residents, and students are part of our team.     Problem list reviewed and updated. 75% of 30 min visit spent on counseling and coordination of care.     Truett Mainland 08/01/2021

## 2021-08-01 NOTE — Progress Notes (Signed)
New OB OB Panel, OB Urine, Pap, GC/CC today Genetic screening offered and accepted Depression and anxiety screen 15/8, declines [facility] referral

## 2021-08-02 LAB — COMPREHENSIVE METABOLIC PANEL
ALT: 4 IU/L (ref 0–32)
AST: 12 IU/L (ref 0–40)
Albumin/Globulin Ratio: 2 (ref 1.2–2.2)
Albumin: 4.3 g/dL (ref 3.8–4.8)
Alkaline Phosphatase: 84 IU/L (ref 44–121)
BUN/Creatinine Ratio: 10 (ref 9–23)
BUN: 7 mg/dL (ref 6–20)
Bilirubin Total: 0.4 mg/dL (ref 0.0–1.2)
CO2: 22 mmol/L (ref 20–29)
Calcium: 9.1 mg/dL (ref 8.7–10.2)
Chloride: 103 mmol/L (ref 96–106)
Creatinine, Ser: 0.69 mg/dL (ref 0.57–1.00)
Globulin, Total: 2.1 g/dL (ref 1.5–4.5)
Glucose: 80 mg/dL (ref 70–99)
Potassium: 4.1 mmol/L (ref 3.5–5.2)
Sodium: 138 mmol/L (ref 134–144)
Total Protein: 6.4 g/dL (ref 6.0–8.5)
eGFR: 115 mL/min/{1.73_m2} (ref >59)

## 2021-08-02 LAB — CBC/D/PLT+RPR+RH+ABO+RUBIGG...
Antibody Screen: NEGATIVE
Basophils Absolute: 0.1 10*3/uL (ref 0.0–0.2)
Basos: 1 %
EOS (ABSOLUTE): 0.2 10*3/uL (ref 0.0–0.4)
Eos: 2 %
HCV Ab: NONREACTIVE
HIV Screen 4th Generation wRfx: NONREACTIVE
Hematocrit: 42.1 % (ref 34.0–46.6)
Hemoglobin: 14.2 g/dL (ref 11.1–15.9)
Hepatitis B Surface Ag: NEGATIVE
Immature Grans (Abs): 0 10*3/uL (ref 0.0–0.1)
Immature Granulocytes: 0 %
Lymphocytes Absolute: 1.2 10*3/uL (ref 0.7–3.1)
Lymphs: 19 %
MCH: 29.7 pg (ref 26.6–33.0)
MCHC: 33.7 g/dL (ref 31.5–35.7)
MCV: 88 fL (ref 79–97)
Monocytes Absolute: 0.3 10*3/uL (ref 0.1–0.9)
Monocytes: 5 %
Neutrophils Absolute: 4.8 10*3/uL (ref 1.4–7.0)
Neutrophils: 73 %
Platelets: 179 10*3/uL (ref 150–450)
RBC: 4.78 x10E6/uL (ref 3.77–5.28)
RDW: 13.1 % (ref 11.7–15.4)
RPR Ser Ql: NONREACTIVE
Rh Factor: POSITIVE
Rubella Antibodies, IGG: 5.12 index (ref >0.99)
WBC: 6.6 10*3/uL (ref 3.4–10.8)

## 2021-08-02 LAB — HCV INTERPRETATION

## 2021-08-02 LAB — PROTEIN / CREATININE RATIO, URINE
Creatinine, Urine: 29.9 mg/dL
Protein, Ur: 5.7 mg/dL
Protein/Creat Ratio: 191 mg/g creat (ref 0–200)

## 2021-08-02 LAB — HEMOGLOBIN A1C
Est. average glucose Bld gHb Est-mCnc: 100 mg/dL
Hgb A1c MFr Bld: 5.1 % (ref 4.8–5.6)

## 2021-08-03 DIAGNOSIS — O10911 Unspecified pre-existing hypertension complicating pregnancy, first trimester: Secondary | ICD-10-CM | POA: Diagnosis not present

## 2021-08-03 DIAGNOSIS — Z3A10 10 weeks gestation of pregnancy: Secondary | ICD-10-CM | POA: Diagnosis not present

## 2021-08-03 DIAGNOSIS — O26891 Other specified pregnancy related conditions, first trimester: Secondary | ICD-10-CM | POA: Diagnosis not present

## 2021-08-03 DIAGNOSIS — R339 Retention of urine, unspecified: Secondary | ICD-10-CM | POA: Diagnosis not present

## 2021-08-03 DIAGNOSIS — O09521 Supervision of elderly multigravida, first trimester: Secondary | ICD-10-CM | POA: Diagnosis not present

## 2021-08-03 DIAGNOSIS — Z419 Encounter for procedure for purposes other than remedying health state, unspecified: Secondary | ICD-10-CM | POA: Diagnosis not present

## 2021-08-03 LAB — URINE CULTURE, OB REFLEX

## 2021-08-03 LAB — CULTURE, OB URINE

## 2021-08-05 ENCOUNTER — Encounter: Payer: Self-pay | Admitting: Obstetrics and Gynecology

## 2021-08-05 ENCOUNTER — Ambulatory Visit (INDEPENDENT_AMBULATORY_CARE_PROVIDER_SITE_OTHER): Payer: Medicaid Other | Admitting: Obstetrics and Gynecology

## 2021-08-05 DIAGNOSIS — R339 Retention of urine, unspecified: Secondary | ICD-10-CM

## 2021-08-05 LAB — CYTOLOGY - PAP
Chlamydia: NEGATIVE
Comment: NEGATIVE
Comment: NEGATIVE
Comment: NORMAL
Diagnosis: NEGATIVE
High risk HPV: NEGATIVE
Neisseria Gonorrhea: NEGATIVE

## 2021-08-05 NOTE — Progress Notes (Signed)
   PRENATAL VISIT NOTE  Subjective:  Margaret Ryan is a 36 y.o. G3P2002 at [redacted]w[redacted]d being seen today for ongoing prenatal care.  She is currently monitored for the following issues for this high-risk pregnancy and has Chronic hypertension; Supervision of other normal pregnancy, antepartum; and Urinary retention on their problem list.  Patient reports  acute urinary retention over the weekend. Happened in prior pregnancy. She had catheter placed on 7/1 in the ER . She had this before with her prior pregnancy but had to be catheterized in the office once and then it resolved. It has happened a few times this pregnancy but only once could she not self resolve it.  Contractions: Not present. Vag. Bleeding: None.  Movement: Absent. Denies leaking of fluid.   The following portions of the patient's history were reviewed and updated as appropriate: allergies, current medications, past family history, past medical history, past social history, past surgical history and problem list.   Objective:   Vitals:   08/05/21 0920  BP: 133/75  Pulse: 76  Weight: 259 lb (117.5 kg)    Fetal Status:     Movement: Absent     General:  Alert, oriented and cooperative. Patient is in no acute distress.  Skin: Skin is warm and dry. No rash noted.   Cardiovascular: Normal heart rate noted  Respiratory: Normal respiratory effort, no problems with respiration noted  Abdomen: Soft, gravid, appropriate for gestational age.  Pain/Pressure: Absent     Pelvic: Cervical exam deferred        Extremities: Normal range of motion.  Edema: None  Mental Status: Normal mood and affect. Normal behavior. Normal judgment and thought content.   Assessment and Plan:  Pregnancy: G3P2002 at [redacted]w[redacted]d 1. Urinary retention - Likely related to position of uterus in first trimester - Requested catheter out so I removed it.  - Supplies given for self cath at home. Reviewed anatomy with pt and husband - Plan urogyn and PFPT after  delivery  Please refer to After Visit Summary for other counseling recommendations.   Return in about 4 weeks (around 09/02/2021) for OB VISIT, MD or APP.  Future Appointments  Date Time Provider Department Center  08/30/2021  8:15 AM Anyanwu, Jethro Bastos, MD [facility] None  09/27/2021  8:35 AM Levie Heritage, DO [facility] None  09/27/2021 10:15 AM [facility] NURSE [facility]  09/27/2021 10:30 AM [facility] US3 [facility]  10/25/2021 10:15 AM Adrian Blackwater, Rhona Raider, DO [facility] None    Milas Hock, MD

## 2021-08-08 LAB — PANORAMA PRENATAL TEST FULL PANEL:PANORAMA TEST PLUS 5 ADDITIONAL MICRODELETIONS: FETAL FRACTION: 4.2

## 2021-08-16 ENCOUNTER — Inpatient Hospital Stay (HOSPITAL_COMMUNITY): Payer: Medicaid Other

## 2021-08-16 ENCOUNTER — Encounter (HOSPITAL_COMMUNITY): Payer: Self-pay | Admitting: Obstetrics and Gynecology

## 2021-08-16 ENCOUNTER — Other Ambulatory Visit: Payer: Self-pay

## 2021-08-16 ENCOUNTER — Inpatient Hospital Stay (HOSPITAL_COMMUNITY)
Admission: AD | Admit: 2021-08-16 | Discharge: 2021-08-16 | Disposition: A | Discharge status: Home / Self Care | Payer: Medicaid Other | Attending: Obstetrics and Gynecology | Admitting: Obstetrics and Gynecology

## 2021-08-16 ENCOUNTER — Telehealth: Payer: Self-pay

## 2021-08-16 DIAGNOSIS — O09521 Supervision of elderly multigravida, first trimester: Secondary | ICD-10-CM | POA: Insufficient documentation

## 2021-08-16 DIAGNOSIS — Z3A12 12 weeks gestation of pregnancy: Secondary | ICD-10-CM

## 2021-08-16 DIAGNOSIS — S22009A Unspecified fracture of unspecified thoracic vertebra, initial encounter for closed fracture: Secondary | ICD-10-CM | POA: Diagnosis not present

## 2021-08-16 DIAGNOSIS — O26891 Other specified pregnancy related conditions, first trimester: Secondary | ICD-10-CM | POA: Diagnosis not present

## 2021-08-16 DIAGNOSIS — O99891 Other specified diseases and conditions complicating pregnancy: Secondary | ICD-10-CM | POA: Insufficient documentation

## 2021-08-16 DIAGNOSIS — R339 Retention of urine, unspecified: Secondary | ICD-10-CM

## 2021-08-16 DIAGNOSIS — I629 Nontraumatic intracranial hemorrhage, unspecified: Secondary | ICD-10-CM | POA: Diagnosis not present

## 2021-08-16 DIAGNOSIS — Z3480 Encounter for supervision of other normal pregnancy, unspecified trimester: Secondary | ICD-10-CM

## 2021-08-16 LAB — URINALYSIS, ROUTINE W REFLEX MICROSCOPIC
Bilirubin Urine: NEGATIVE
Glucose, UA: NEGATIVE mg/dL
Hgb urine dipstick: NEGATIVE
Ketones, ur: 5 mg/dL — AB
Leukocytes,Ua: NEGATIVE
Nitrite: NEGATIVE
Protein, ur: NEGATIVE mg/dL
Specific Gravity, Urine: 1.008 (ref 1.005–1.030)
pH: 5 (ref 5.0–8.0)

## 2021-08-16 MED ORDER — IBUPROFEN 600 MG PO TABS
600.0000 mg | ORAL_TABLET | Freq: Once | ORAL | Status: AC
  Administered 2021-08-16: 600 mg via ORAL
  Filled 2021-08-16: qty 1

## 2021-08-16 MED ORDER — CYCLOBENZAPRINE HCL 5 MG PO TABS
10.0000 mg | ORAL_TABLET | Freq: Once | ORAL | Status: AC
Start: 2021-08-16 — End: 2021-08-16
  Administered 2021-08-16: 10 mg via ORAL
  Filled 2021-08-16: qty 2

## 2021-08-16 NOTE — MAU Provider Note (Addendum)
History     CSN: [id number]  Arrival date and time: 08/16/21 1147   None     Chief Complaint  Patient presents with   Urinary Retention   HPI  Ms.Chrisandra Deannah Rossi is a 36 y.o. female G3P2002 @ [redacted]w[redacted]d here in MAU with complaints of urinary retention. This is the second time in this pregnancy where she had to be seen in the ED for urinary retention. Her initial episode started at 10w.   At times she has to bear down to urinate, today bearing down did not work and she was unable to urinate. She attempted self catheter at home which was provided and educated on at her last OB visit on 7/3. She became sweaty and nervous and could not self cath.  Since her last delivery she has had urinary leakage, however no retention until recently. She has no vaginal bleeding, no dysuria, no fever. No hematuria.   She has chronic bilateral lower back pain that has worsened in the last week.  She has had sciatic nerve pain in the past and with previous pregnancies, however she feels her back pain is worsening with this pregnancy. She reports some mild lower extremities numbness, however worsening numbness and tingling in her upper extremities; bilateral. She is able to ambulate without problems, however has been using tylenol regularly for the pain. At times she feels like she has to bear down quite hard in order to have a bowel movement.   OB History     Gravida  3   Para  2   Term  2   Preterm      AB      Living  2      SAB      IAB      Ectopic      Multiple  0   Live Births  1           Past Medical History:  Diagnosis Date   Anxiety    Complication of anesthesia    She thinks itching after general anesth w/tonsillectomy   Hypertension     Past Surgical History:  Procedure Laterality Date   TONSILLECTOMY      Family History  Problem Relation Age of Onset   Cancer Father    Hypertension Brother     Social History   Tobacco Use   Smoking status: Former    Smokeless tobacco: Never  Building services engineer Use: Never used  Substance Use Topics   Alcohol use: Not Currently    Comment: Occas   Drug use: Never    Allergies: No Known Allergies  Medications Prior to Admission  Medication Sig Dispense Refill Last Dose   Ascorbic Acid (VITAMIN C PO) Take 1 tablet by mouth daily.    Past Week   Cetirizine HCl (ZYRTEC ALLERGY) 10 MG CAPS Take 10 mg by mouth daily.   Past Month   Doxylamine-Pyridoxine (DICLEGIS) 10-10 MG TBEC Take 2 tablets by mouth at bedtime. If symptoms persist, add one tablet in the morning and one in the afternoon 100 tablet 5 08/15/2021   fluticasone (FLONASE) 50 MCG/ACT nasal spray Place 1-2 sprays into both nostrils daily as needed for allergies or rhinitis.   Past Month   Methylcobalamin (METHYL B-12 PO) Take 1 tablet by mouth daily.   Past Week   Prenatal Vit-Fe Fumarate-FA (PRENATAL VITAMIN PO) Take 1 tablet by mouth daily.    08/16/2021   promethazine (PHENERGAN) 25 MG tablet Take  1 tablet (25 mg total) by mouth every 6 (six) hours as needed for nausea or vomiting. 30 tablet 1 08/15/2021   Results for orders placed or performed during the hospital encounter of 08/16/21 (from the past 48 hour(s))  Urinalysis, Routine w reflex microscopic Urine, Catheterized     Status: Abnormal   Collection Time: 08/16/21  3:12 PM  Result Value Ref Range   Color, Urine YELLOW YELLOW   APPearance CLEAR CLEAR   Specific Gravity, Urine 1.008 1.005 - 1.030   pH 5.0 5.0 - 8.0   Glucose, UA NEGATIVE NEGATIVE mg/dL   Hgb urine dipstick NEGATIVE NEGATIVE   Bilirubin Urine NEGATIVE NEGATIVE   Ketones, ur 5 (A) NEGATIVE mg/dL   Protein, ur NEGATIVE NEGATIVE mg/dL   Nitrite NEGATIVE NEGATIVE   Leukocytes,Ua NEGATIVE NEGATIVE    Comment: Performed at [facility], [address] [place], [place] [address]    MR CERVICAL SPINE WO CONTRAST  Result Date: 08/16/2021 CLINICAL DATA:  Initial evaluation for urinary retention, back pain. EXAM: MRI  CERVICAL SPINE WITHOUT CONTRAST TECHNIQUE: Multiplanar, multisequence MR imaging of the cervical spine was performed. No intravenous contrast was administered. COMPARISON:  None Available. FINDINGS: Alignment: Straightening of the normal cervical lordosis. No listhesis. Vertebrae: Vertebral body height maintained without acute or chronic fracture. Bone marrow signal intensity within normal limits. No discrete or worrisome osseous lesions. No abnormal marrow edema. Cord: Normal signal and morphology. Posterior Fossa, vertebral arteries, paraspinal tissues: Unremarkable. Disc levels: No significant disc pathology seen within the cervical spine. No disc bulge or focal disc herniation. No significant facet disease. No canal or foraminal stenosis or evidence for neural impingement. IMPRESSION: Normal MRI of the cervical spine. Electronically Signed   By: Rise Mu M.D.   On: 08/16/2021 20:45   MR BRAIN WO CONTRAST  Result Date: 08/16/2021 CLINICAL DATA:  Initial evaluation for urinary retention, back pain. EXAM: MRI HEAD WITHOUT CONTRAST TECHNIQUE: Multiplanar, multiecho pulse sequences of the brain and surrounding structures were obtained without intravenous contrast. COMPARISON:  None Available. FINDINGS: Brain: Cerebral volume within normal limits for patient age. No focal parenchymal signal abnormality identified. No abnormal foci of restricted diffusion to suggest acute or subacute ischemia. Gray-white matter differentiation well maintained. No encephalomalacia to suggest chronic infarction. No foci of susceptibility artifact to suggest acute or chronic intracranial hemorrhage. No mass lesion, midline shift or mass effect. No hydrocephalus. No extra-axial fluid collection. Pituitary gland and suprasellar region are normal. Midline structures intact and normal. Vascular: Major intracranial vascular flow voids are well maintained. Skull and upper cervical spine: Craniocervical junction normal.  Visualized upper cervical spine within normal limits. Bone marrow signal intensity normal. No scalp soft tissue abnormality. Sinuses/Orbits: Globes and orbital soft tissues within normal limits. Paranasal sinuses are largely clear. No significant mastoid effusion. Other: None. IMPRESSION: Normal brain MRI.  No acute intracranial abnormality. Electronically Signed   By: Rise Mu M.D.   On: 08/16/2021 20:40   MR LUMBAR SPINE WO CONTRAST  Result Date: 08/16/2021 CLINICAL DATA:  Urinary retention, [redacted] weeks gestation. EXAM: MRI THORACIC AND LUMBAR SPINE WITHOUT CONTRAST TECHNIQUE: Multiplanar and multiecho pulse sequences of the thoracic and lumbar spine were obtained without intravenous contrast. COMPARISON:  None Available. FINDINGS: MRI THORACIC SPINE FINDINGS Alignment:  Physiologic. Vertebrae: No fracture, evidence of discitis, or bone lesion. Cord: Mild mass effect on the left anterolateral aspect of the spinal cord at the T8-9 level without cord signal abnormality. Paraspinal and other soft tissues: Negative. Disc  levels: Left posterolateral disc protrusion at T8-9 causing mild mass effect on the left under lateral aspect of the cord without cord signal abnormality, significant spinal canal or neural foraminal stenosis. No significant disc bulge or herniation, spinal canal or neural foraminal stenosis in the other thoracic levels. MRI LUMBAR SPINE FINDINGS Segmentation:  Standard. Alignment:  Physiologic. Vertebrae:  No fracture, evidence of discitis, or bone lesion. Conus medullaris and cauda equina: Conus extends to the L1 level. Conus and cauda equina appear normal. Paraspinal and other soft tissues: Incompletely visualized intrauterine pregnancy. Disc levels: T12-L1:No spinal canal or neural foraminal stenosis. L1-2:No spinal canal or neural foraminal stenosis. L2-3:No spinal canal or neural foraminal stenosis. L3-4:Small right foraminal disc protrusion resulting in mild to moderate right  neural foraminal narrowing.No significant spinal canal stenosis. L4-5:Shallow disc bulge with superimposed small central disc protrusion and right central annular tear results in mild spinal canal stenosis and mild right neural foraminal narrowing. L5-S1:Mild facet degenerative changes.No spinal canal or neural foraminal stenosis. IMPRESSION: MR THORACIC SPINE IMPRESSION Left posterolateral disc protrusion at T8-9 causing mild mass effect on the left under lateral aspect of the cord without cord signal abnormality, significant spinal canal or neural foraminal stenosis. MR LUMBAR SPINE IMPRESSION At L3-4, small right foraminal disc protrusion results in mild-to-moderate right neural foraminal narrowing. At L4-5, disc bulge with central disc protrusion resulting in very mild spinal canal stenosis and mild right neural foraminal narrowing Electronically Signed   By: Baldemar Lenis M.D.   On: 08/16/2021 17:59   MR THORACIC SPINE WO CONTRAST  Result Date: 08/16/2021 CLINICAL DATA:  Urinary retention, [redacted] weeks gestation. EXAM: MRI THORACIC AND LUMBAR SPINE WITHOUT CONTRAST TECHNIQUE: Multiplanar and multiecho pulse sequences of the thoracic and lumbar spine were obtained without intravenous contrast. COMPARISON:  None Available. FINDINGS: MRI THORACIC SPINE FINDINGS Alignment:  Physiologic. Vertebrae: No fracture, evidence of discitis, or bone lesion. Cord: Mild mass effect on the left anterolateral aspect of the spinal cord at the T8-9 level without cord signal abnormality. Paraspinal and other soft tissues: Negative. Disc levels: Left posterolateral disc protrusion at T8-9 causing mild mass effect on the left under lateral aspect of the cord without cord signal abnormality, significant spinal canal or neural foraminal stenosis. No significant disc bulge or herniation, spinal canal or neural foraminal stenosis in the other thoracic levels. MRI LUMBAR SPINE FINDINGS Segmentation:  Standard. Alignment:   Physiologic. Vertebrae:  No fracture, evidence of discitis, or bone lesion. Conus medullaris and cauda equina: Conus extends to the L1 level. Conus and cauda equina appear normal. Paraspinal and other soft tissues: Incompletely visualized intrauterine pregnancy. Disc levels: T12-L1:No spinal canal or neural foraminal stenosis. L1-2:No spinal canal or neural foraminal stenosis. L2-3:No spinal canal or neural foraminal stenosis. L3-4:Small right foraminal disc protrusion resulting in mild to moderate right neural foraminal narrowing.No significant spinal canal stenosis. L4-5:Shallow disc bulge with superimposed small central disc protrusion and right central annular tear results in mild spinal canal stenosis and mild right neural foraminal narrowing. L5-S1:Mild facet degenerative changes.No spinal canal or neural foraminal stenosis. IMPRESSION: MR THORACIC SPINE IMPRESSION Left posterolateral disc protrusion at T8-9 causing mild mass effect on the left under lateral aspect of the cord without cord signal abnormality, significant spinal canal or neural foraminal stenosis. MR LUMBAR SPINE IMPRESSION At L3-4, small right foraminal disc protrusion results in mild-to-moderate right neural foraminal narrowing. At L4-5, disc bulge with central disc protrusion resulting in very mild spinal canal stenosis and mild right neural foraminal  narrowing Electronically Signed   By: Baldemar Lenis M.D.   On: 08/16/2021 17:59     Review of Systems  Constitutional:  Negative for fever.  Genitourinary:  Positive for difficulty urinating. Negative for decreased urine volume, dyspareunia, dysuria, enuresis, flank pain, frequency and urgency.   Physical Exam   Blood pressure 130/78, pulse 73, temperature 98.5 F (36.9 C), temperature source Oral, resp. rate 18, height 5\' 6"  (1.676 m), weight 119.5 kg, last menstrual period 05/11/2021, SpO2 99 %, unknown if currently breastfeeding.  Physical Exam Constitutional:       General: She is not in acute distress.    Appearance: Normal appearance. She is not ill-appearing, toxic-appearing or diaphoretic.  HENT:     Head: Normocephalic.  Eyes:     Pupils: Pupils are equal, round, and reactive to light.  Musculoskeletal:     Cervical back: No swelling or edema.     Lumbar back: Tenderness present. No swelling, edema or signs of trauma. Normal range of motion. No scoliosis.  Neurological:     Mental Status: She is alert and oriented to person, place, and time.  Psychiatric:        Behavior: Behavior normal.    MAU Course  Procedures  MDM  + doppler  Bladder scan upon arrival showed 560 cc Catheter placed- after 1 hour 1000 cc out.  Discussed patient with Dr. 07/11/2021 with Urology who felt it was appropriate to send patient for MRI for lumbar/thoracic spine to idenitify compression that could be causing urinary retention.  Reviewed MRI results with Dr. Berneice Heinrich who recommends Neuro consult for outpatient f/u for MS workup.  Consulted with Dr. Despina Hidden with Neuro @ [phone] who would like his neuro team to come to MAU for official consult. The patient is aware and agreeable to plan of care.  Neuro returned call @ 1845 and requested an MRI of C/spine and MRI of brain. Neuro team called for a code stroke and will see patient following.  Ibuprofen and flexeril given for lower back pain.  Report given to [id number] CNM who resumes care of the patient.   Assessment and Plan  Reassessment (9:15 PM)  -Results return as above. -Dr. Gerrit Heck with Neurology contacted and informed of findings. Initially states that no confounding neurology presentations and that compressive issues are likely causative source.  However, in light of patient c/o numbness and tingling in upper extremities and worsening in lower extremities, Dr. Amada Jupiter to come to bedside for consult. -Provider to bedside to discuss and patient questions if she will be admitted. Informed that provider unsure  at this time.   Reassessment (10:06 PM)  -Dr. Amada Jupiter reports patient neurologically cleared. -Dr. Amada Jupiter consulted and informed of results.  States referral should be placed and patient to call office for appt. -Referral placed.  -Provider to bedside to inform of POC.  Addressed questions. -Patient given leg bag for urine.  -Message sent to [facility] for patient to have an appt on Monday or Tuesday.  -Encouraged to call primary office or return to MAU if symptoms worsen or with the onset of new symptoms. -Discharged to home in stable condition.  Monday MSN, CNM Advanced Practice Provider, [facility]

## 2021-08-16 NOTE — MAU Note (Signed)
Pt was very uncomfortable when she came in. Wants a catheter to relieve her bladder distention. Bladder scanned pt and 568cc noted. Notified provider. Order to place foley cath. Inserted foley and 350cc clear yellow urine came out initially . 1000cc after 1hr. Pt feels relief from pressure and discomfort.

## 2021-08-16 NOTE — MAU Note (Signed)
Dr. Amada Jupiter at bedside with Pt.

## 2021-08-16 NOTE — Consult Note (Signed)
Neurology Consultation Reason for Consult: Urinary retention Referring Physician:   CC: Urinary retention  History is obtained from: Patient, husband  HPI: Margaret Ryan is a 36 y.o. female with a history of hypertension who presents with urinary retention.  At the beginning of July, she went to the emergency department with inability to pee.  She states that she feels the urge to pee, but she feels like when she bears down she just is unable to get anything to come out.  She had a catheter placed and then followed up with her OB/GYN a few days later where it was removed.  She sought care in the MAU for similar symptoms today and also complained of some numbness and weakness.  Her numbness and weakness that she describes as intermittent and has been happening for years.  She states that if she lays on her arms, they will go numb and then improve after she gets up.  Also if she is sitting in a position that is amenable to it, her legs will go to sleep as well.  She feels that this is worse ever since her last pregnancy 7 years ago.  She denies any episodes of numbness or weakness that come and remain for period of time.  She denies any episodes consistent with optic neuritis.  Urology was consulted who felt that MS was a possibility and therefore MRIs of her entire neuraxis were obtained which were negative.   Past Medical History:  Diagnosis Date   Anxiety    Complication of anesthesia    She thinks itching after general anesth w/tonsillectomy   Hypertension      Family History  Problem Relation Age of Onset   Cancer Father    Hypertension Brother      Social History:  reports that she has quit smoking. She has never used smokeless tobacco. She reports that she does not currently use alcohol. She reports that she does not use drugs.   Exam: Current vital signs: BP 130/78   Pulse 73   Temp 98.5 F (36.9 C) (Oral)   Resp 18   Ht 5\' 6"  (1.676 m)   Wt 119.5 kg   LMP  05/11/2021   SpO2 99% Comment: room air  BMI 42.53 kg/m  Vital signs in last 24 hours: Temp:  [98.5 F (36.9 C)] 98.5 F (36.9 C) (07/14 1213) Pulse Rate:  [73-81] 73 (07/14 1300) Resp:  [18] 18 (07/14 1213) BP: (130-166)/(78-89) 130/78 (07/14 1300) SpO2:  [99 %] 99 % (07/14 1213) Weight:  [119.3 kg-119.5 kg] 119.3 kg (07/14 1908)   Physical Exam  Constitutional: Appears well-developed and well-nourished.   Neuro: Mental Status: Patient is awake, alert, oriented to person, place, month, year, and situation. Patient is able to give a clear and coherent history. No signs of aphasia or neglect Cranial Nerves: II: Visual Fields are full. Pupils are equal, round, and reactive to light.   III,IV, VI: EOMI without ptosis or diploplia.  V: Facial sensation is symmetric to temperature VII: Facial movement is symmetric.  VIII: hearing is intact to voice X: Uvula elevates symmetrically XI: Shoulder shrug is symmetric. XII: tongue is midline without atrophy or fasciculations.  Motor: Tone is normal. Bulk is normal. 5/5 strength was present in all four extremities.  Sensory: Sensation is symmetric to light touch and temperature in the arms and legs. Deep Tendon Reflexes: 2+ and symmetric in the biceps and patellae.  Plantars: Toes are downgoing bilaterally.  Cerebellar: FNF are intact  bilaterally      I have reviewed labs in epic and the results pertinent to this consultation are: Ua - negative  I have reviewed the images obtained: MRI neuraxis-negative  Impression: 36 year old female with urinary retention that by description sounds obstructive.  Neurogenic bladder is typically associated with overflow incontinence, lack of sensation of the need to void, much less common to have the acute urge to void and inability to do so.  The numbness/weakness that was described sounds like her arms or legs "going to sleep."  At this time I do feel there needs to be any further  evaluation, nor any further neurological work-up.  She can follow-up with urology as an outpatient.  Recommendations: 1) urology outpatient follow-up 2) management of retention per ER.   Ritta Slot, MD [facility] [phone]  If 7pm- 7am, please page neurology on call as listed in AMION.

## 2021-08-16 NOTE — Telephone Encounter (Signed)
Pt called stating she was still having issued with urinary retention. Pt tried self cath at home with no luck. Per Dr. Para March, pt needs to report to MAU.

## 2021-08-16 NOTE — MAU Note (Signed)
Margaret Ryan is a 36 y.o. at 45w2dhere in MAU reporting: was able to use the bathroom around 9 this AM and then tried to use the bathroom again and she was unable to void. Attempted to use a self cath kit but was unsuccessful. Is having a lot of pain and pressure. No bleeding today, saw some spotting since last night.   Onset of complaint: today  Pain score: 5/10  Vitals:   08/16/21 1213  BP: (!) 166/89  Pulse: 81  Resp: 18  Temp: 98.5 F (36.9 C)  SpO2: 99%     Lab orders placed from triage: UA

## 2021-08-20 ENCOUNTER — Encounter: Payer: Medicaid Other | Admitting: Advanced Practice Midwife

## 2021-08-22 ENCOUNTER — Ambulatory Visit (INDEPENDENT_AMBULATORY_CARE_PROVIDER_SITE_OTHER): Payer: Medicaid Other | Admitting: Obstetrics and Gynecology

## 2021-08-22 ENCOUNTER — Encounter: Payer: Self-pay | Admitting: Obstetrics and Gynecology

## 2021-08-22 VITALS — BP 148/99 | HR 88 | Ht 66.0 in | Wt 261.0 lb

## 2021-08-22 DIAGNOSIS — M62838 Other muscle spasm: Secondary | ICD-10-CM | POA: Diagnosis not present

## 2021-08-22 DIAGNOSIS — N812 Incomplete uterovaginal prolapse: Secondary | ICD-10-CM

## 2021-08-22 DIAGNOSIS — N8110 Cystocele, unspecified: Secondary | ICD-10-CM | POA: Diagnosis not present

## 2021-08-22 DIAGNOSIS — R350 Frequency of micturition: Secondary | ICD-10-CM

## 2021-08-22 DIAGNOSIS — R339 Retention of urine, unspecified: Secondary | ICD-10-CM | POA: Diagnosis not present

## 2021-08-22 DIAGNOSIS — K5904 Chronic idiopathic constipation: Secondary | ICD-10-CM | POA: Diagnosis not present

## 2021-08-22 LAB — POCT URINALYSIS DIPSTICK
Bilirubin, UA: NEGATIVE
Glucose, UA: NEGATIVE
Ketones, UA: NEGATIVE
Leukocytes, UA: NEGATIVE
Nitrite, UA: NEGATIVE
Protein, UA: NEGATIVE
Spec Grav, UA: 1.02 (ref 1.010–1.025)
Urobilinogen, UA: 0.2 E.U./dL
pH, UA: 6.5 (ref 5.0–8.0)

## 2021-08-22 MED ORDER — CYCLOBENZAPRINE HCL 5 MG PO TABS: 5.0000 mg | ORAL_TABLET | Freq: Three times a day (TID) | ORAL | 5 refills | Status: DC | PRN

## 2021-08-22 NOTE — Patient Instructions (Addendum)
Start Miralax, once capful per day for constipation.   Start flexeril 5mg  nightly for two weeks then as needed after. Can take up to three times per day.   Referral placed for pelvic physical therapy.

## 2021-08-22 NOTE — Progress Notes (Signed)
[facility] New Patient Evaluation and Consultation  Referring Provider: Gerrit Heck, CNM PCP: Pcp, No Date of Service: 08/22/2021  SUBJECTIVE Chief Complaint: New Patient (Initial Visit)  History of Present Illness: Margaret Ryan is a 36 y.o. White or Caucasian female seen in consultation at the request of CNM Gerrit Heck for evaluation of urinary retention in pregnancy.    Review of records significant for: Currently pregnant, 13w 1d. Was seen in ER/ MAU twice for urinary retention symptoms. Underwent MRI on the spine and pelvis and patient was neurologically cleared. She was seen in office for self-cath teaching but was unable to perform at home.   Urinary Symptoms: Leaks urine with cough/ sneeze, without sensation, and continuously Not sure how often she leaks Pad use:  liners/ mini-pads  She is bothered by her UI symptoms.  Day time voids 4-5.  Nocturia: 2-3 times per night to void. Frequency has increased since pregnancy Voiding dysfunction: she does not empty her bladder well.  When urinating, she feels a weak stream, difficulty starting urine stream, and dribbling after finishing.  With first pregnancy, did not have any urinary issues. During second pregnancy, had one instance where she had urinary retention, had to be catheterized and it resolved. After second birth, had some tearing and had more issues with leakage after and was harder to have a bowel movement.  About a week before her initial visit for current pregnancy, had some issues urinating but was able to force urine out. Then after that visit, went to ED on 7/1 and had a catheter placed. OB took catheter out after the weekend. Currently has catheter in place- placed in MAU on 08/16/21.   UTIs:  0  UTI's in the last year.   Denies history of blood in urine and kidney or bladder stones  Pelvic Organ Prolapse Symptoms:                  She is not sure if she feels any prolapse. Has not "felt  right" since her last pregnancy. Sometimes has felt a bulge that she pushes back up.   Bowel Symptom: Bowel movements: 2 time(s) per day, but has to forcefully push out. Feels constipation may be affecting urinating as well.  Stool consistency: hard or soft  Straining: yes.  Splinting: no.  Incomplete evacuation: no.  She Denies accidental bowel leakage / fecal incontinence Bowel regimen: none Last colonoscopy: n/a  Sexual Function Sexually active: yes.  Pain with sex: No  Pelvic Pain Denies pelvic pain  Has had increased back pain since the pregnancy. Had taken flexeril in MAU which helped some.    Past Medical History:  Past Medical History:  Diagnosis Date   Anxiety    Complication of anesthesia    She thinks itching after general anesth w/tonsillectomy   Hypertension      Past Surgical History:   Past Surgical History:  Procedure Laterality Date   TONSILLECTOMY       Past OB/GYN History: OB History  Gravida Para Term Preterm AB Living  3 2 2     2   SAB IAB Ectopic Multiple Live Births        0 2    # Outcome Date GA Lbr Len/2nd Weight Sex Delivery Anes PTL Lv  3 Current           2 Term 10/27/19 [redacted]w[redacted]d 04:22 / 00:08 7 lb 1 oz (3.204 kg) M Vag-Spont EPI  LIV  1 Term  Vag-Spont      Currently pregnant- 13 weeks Last pap smear was 07/2021- negative.     Medications: She has a current medication list which includes the following prescription(s): ascorbic acid, zyrtec allergy, cyclobenzaprine, doxylamine-pyridoxine, fluticasone, methylcobalamin, prenatal vit-fe fumarate-fa, and promethazine.   Allergies: Patient has No Known Allergies.   Social History:  Social History   Tobacco Use   Smoking status: Former   Smokeless tobacco: Never  Building services engineer Use: Never used  Substance Use Topics   Alcohol use: Not Currently    Comment: Occas   Drug use: Never    Relationship status: married She lives with husband and children.   She is employed  as a Education administrator. Regular exercise: No History of abuse: No  Family History:   Family History  Problem Relation Age of Onset   Cancer Father    Hypertension Brother      Review of Systems: Review of Systems  Constitutional:  Positive for malaise/fatigue. Negative for fever and weight loss.  Respiratory:  Negative for cough, shortness of breath and wheezing.   Cardiovascular:  Negative for chest pain, palpitations and leg swelling.  Gastrointestinal:  Negative for abdominal pain and blood in stool.  Genitourinary:  Negative for dysuria.       +vaginal discharge  Musculoskeletal:  Negative for myalgias.  Skin:  Negative for rash.  Neurological:  Negative for dizziness and headaches.  Endo/Heme/Allergies:  Bruises/bleeds easily.  Psychiatric/Behavioral:  Negative for depression. The patient is not nervous/anxious.      OBJECTIVE Physical Exam: Vitals:   08/22/21 1148  BP: (!) 148/99  Pulse: 88  Weight: 261 lb (118.4 kg)  Height: 5\' 6"  (1.676 m)    Physical Exam Constitutional:      General: She is not in acute distress. Pulmonary:     Effort: Pulmonary effort is normal.  Abdominal:     General: There is no distension.     Palpations: Abdomen is soft.     Tenderness: There is no abdominal tenderness. There is no rebound.  Musculoskeletal:        General: No swelling. Normal range of motion.  Skin:    General: Skin is warm and dry.     Findings: No rash.  Neurological:     Mental Status: She is alert and oriented to person, place, and time.  Psychiatric:        Mood and Affect: Mood normal.        Behavior: Behavior normal.      GU / Detailed Urogynecologic Evaluation:  Pelvic Exam: Normal external female genitalia; Bartholin's and Skene's glands normal in appearance; urethral meatus normal in appearance, has catheter in place   Speculum exam reveals normal vaginal mucosa without atrophy. Cervix normal appearance. Uterus enlarged. Adnexa no mass,  fullness, tenderness.    Pelvic floor strength II/V  Pelvic floor musculature: Right levator tender, Right obturator tender, Left levator tender, Left obturator tender  POP-Q:   POP-Q  -0.5                                            Aa   -0.5  Ba  -5.5                                              C   4                                            Gh  4.5                                            Pb  10                                            tvl   -2.5                                            Ap  -2.5                                            Bp  -9                                              D     Rectal Exam:  Normal external rectum  Post-Void Residual (PVR) by Bladder Scan: Not performed due to pregnancy  Laboratory Results: POC urine: small blood, otherwise negative   ASSESSMENT AND PLAN Ms. Caputo is a 36 y.o. with:  1. Incomplete bladder emptying   2. Urinary frequency   3. Levator spasm   4. Prolapse of anterior vaginal wall   5. Uterovaginal prolapse, incomplete   6. Chronic idiopathic constipation    - Has intermittent incomplete bladder emptying and we discussed that there are several factors which may be contributing but no clear etiology.  - High tone pelvic floor muscle tension noted and increased back pain. Recommended pelvic floor physical therapy and referral placed. Flexeril 5mg  prescribed. Recommended taking nightly for two weeks to help decrease muscle tension then can take it as needed up to 3 times per day.  - Stage II POP noted. Although prolapse is only moderate, we discussed that with straining, it may progress more and contribute to the urinary retention. Recommended a trial of a pessary and she will return for a fitting.  - Constipation can also contribute to urinary retention, especially with growing uterus and increased compression. Recommended starting miralax daily to have more  complete BMs without straining. She can titrate the miralax as needed.  - Self-cath teaching provided to patient today and she demonstrated catheterizing. Supplies also provided.   Follow up for pessary fitting   , MD   Medical Decision Making:  - Reviewed/ ordered a clinical laboratory test - Review and summation of prior  records

## 2021-08-27 ENCOUNTER — Ambulatory Visit: Payer: Medicaid Other | Admitting: Urology

## 2021-08-27 NOTE — Progress Notes (Deleted)
   Assessment: 1. Incomplete bladder emptying   2. [redacted] weeks gestation of pregnancy     Plan: ***  Chief Complaint: No chief complaint on file.   History of Present Illness:  Margaret Ryan is a 36 y.o. year old female who is seen in consultation from Gerrit Heck, CNM  for evaluation of urinary retention.  She is currently [redacted] weeks pregnant.  She has been seen in the emergency room on 2 occasions for urinary retention symptoms.  She was seen in the emergency room on 7/1 and had a Foley catheter placed.  Her catheter was removed several days later.  She again had a catheter placed on 08/16/2021.  She reported increased frequency and nocturia 2-3 times.  She noted a weak stream, difficulty starting her stream and postvoid dribbling.  She does have baseline symptoms of stress incontinence.  She underwent evaluation by neurology.  MRI evaluation was unremarkable.  She was recently seen by urogynecology on 08/22/2021.  She was found to have high tone pelvic floor muscle tension and stage II prolapse.  She was instructed on self catheterization and arrangements made for pelvic floor physical therapy and a trial of a pessary.   Past Medical History:  Past Medical History:  Diagnosis Date   Anxiety    Complication of anesthesia    She thinks itching after general anesth w/tonsillectomy   Hypertension     Past Surgical History:  Past Surgical History:  Procedure Laterality Date   TONSILLECTOMY      Allergies:  No Known Allergies  Family History:  Family History  Problem Relation Age of Onset   Cancer Father    Hypertension Brother     Social History:  Social History   Tobacco Use   Smoking status: Former   Smokeless tobacco: Never  Building services engineer Use: Never used  Substance Use Topics   Alcohol use: Not Currently    Comment: Occas   Drug use: Never    Review of symptoms:  Constitutional:  Negative for unexplained weight loss, night sweats, fever, chills ENT:   Negative for nose bleeds, sinus pain, painful swallowing CV:  Negative for chest pain, shortness of breath, exercise intolerance, palpitations, loss of consciousness Resp:  Negative for cough, wheezing, shortness of breath GI:  Negative for nausea, vomiting, diarrhea, bloody stools GU:  Positives noted in HPI; otherwise negative for gross hematuria, dysuria, urinary incontinence Neuro:  Negative for seizures, poor balance, limb weakness, slurred speech Psych:  Negative for lack of energy, depression, anxiety Endocrine:  Negative for polydipsia, polyuria, symptoms of hypoglycemia (dizziness, hunger, sweating) Hematologic:  Negative for anemia, purpura, petechia, prolonged or excessive bleeding, use of anticoagulants  Allergic:  Negative for difficulty breathing or choking as a result of exposure to anything; no shellfish allergy; no allergic response (rash/itch) to materials, foods  Physical exam: LMP 05/11/2021  GENERAL APPEARANCE:  Well appearing, well developed, well nourished, NAD HEENT: Atraumatic, Normocephalic, oropharynx clear. NECK: Supple without lymphadenopathy or thyromegaly. LUNGS: Clear to auscultation bilaterally. HEART: Regular Rate and Rhythm without murmurs, gallops, or rubs. ABDOMEN: Soft, non-tender, No Masses. EXTREMITIES: Moves all extremities well.  Without clubbing, cyanosis, or edema. NEUROLOGIC:  Alert and oriented x 3, normal gait, CN II-XII grossly intact.  MENTAL STATUS:  Appropriate. BACK:  Non-tender to palpation.  No CVAT SKIN:  Warm, dry and intact.    Results: U/A:

## 2021-08-30 ENCOUNTER — Ambulatory Visit (INDEPENDENT_AMBULATORY_CARE_PROVIDER_SITE_OTHER): Payer: Medicaid Other | Admitting: Obstetrics & Gynecology

## 2021-08-30 ENCOUNTER — Encounter: Payer: Self-pay | Admitting: Obstetrics and Gynecology

## 2021-08-30 ENCOUNTER — Ambulatory Visit (INDEPENDENT_AMBULATORY_CARE_PROVIDER_SITE_OTHER): Payer: Medicaid Other | Admitting: Obstetrics and Gynecology

## 2021-08-30 VITALS — BP 138/84 | HR 70

## 2021-08-30 VITALS — BP 119/68 | HR 78 | Wt 261.0 lb

## 2021-08-30 DIAGNOSIS — R339 Retention of urine, unspecified: Secondary | ICD-10-CM | POA: Diagnosis not present

## 2021-08-30 DIAGNOSIS — N8110 Cystocele, unspecified: Secondary | ICD-10-CM

## 2021-08-30 DIAGNOSIS — O09522 Supervision of elderly multigravida, second trimester: Secondary | ICD-10-CM

## 2021-08-30 DIAGNOSIS — O0992 Supervision of high risk pregnancy, unspecified, second trimester: Secondary | ICD-10-CM

## 2021-08-30 DIAGNOSIS — O10919 Unspecified pre-existing hypertension complicating pregnancy, unspecified trimester: Secondary | ICD-10-CM

## 2021-08-30 DIAGNOSIS — Z3A14 14 weeks gestation of pregnancy: Secondary | ICD-10-CM

## 2021-08-30 DIAGNOSIS — K5904 Chronic idiopathic constipation: Secondary | ICD-10-CM | POA: Diagnosis not present

## 2021-08-30 DIAGNOSIS — O09529 Supervision of elderly multigravida, unspecified trimester: Secondary | ICD-10-CM | POA: Insufficient documentation

## 2021-08-30 DIAGNOSIS — O99210 Obesity complicating pregnancy, unspecified trimester: Secondary | ICD-10-CM

## 2021-08-30 NOTE — Progress Notes (Signed)
[facility]   Subjective:     Chief Complaint: Pessary Fitting  History of Present Illness: Margaret Ryan is a 36 y.o. female with stage II pelvic organ prolapse and intermittent urinary retention who presents today for a pessary fitting.   She has been using the miralax and she feels this has helped a lot with her constipation and pelvic pressure symptoms. She has been taking the flexeril at night. It does make her a little drowsy and it helps somewhat with her back/ pelvic pain but does not help completely. She has pelvic PT scheduled for next week. Also considering seeing a chiropractor.   Past Medical History: Patient  has a past medical history of Anxiety, Complication of anesthesia, and Hypertension.   Past Surgical History: She  has a past surgical history that includes Tonsillectomy.   Medications: She has a current medication list which includes the following prescription(s): prenatal vit-fe fumarate-fa, ascorbic acid, zyrtec allergy, cyclobenzaprine, doxylamine-pyridoxine, fluticasone, methylcobalamin, and promethazine.   Allergies: Patient has No Known Allergies.   Social History: Patient  reports that she has quit smoking. She has never used smokeless tobacco. She reports that she does not currently use alcohol. She reports that she does not use drugs.      Objective:    BP 138/84   Pulse 70   LMP 05/11/2021  Gen: No apparent distress, A&O x 3. Pelvic Exam: Normal external female genitalia; Bartholin's and Skene's glands normal in appearance; urethral meatus normal in appearance, no urethral masses or discharge.   A size 3 ring with support pessary was fitted but this was too small. Fit with a #4 ring with support. It was comfortable, stayed in place with valsalva and voiding and was an appropriate size on examination, with one finger fitting between the pessary and the vaginal walls. We tied a string to it and the patient demonstrated proper  removal and replacement.   POP-Q:    POP-Q   -0.5                                            Aa   -0.5                                           Ba   -5.5                                              C    4                                            Gh   4.5                                            Pb   10  tvl    -2.5                                            Ap   -2.5                                            Bp   -9                                              D      Assessment/Plan:    Assessment: Margaret Ryan is a 36 y.o. with stage II pelvic organ prolapse who presents for a pessary fitting. Also has intermittent urinary retention and chronic back pain.   Plan: - She was fitted with a #4 ring with support pessary. She will remove at least weekly .  - For back pain, recommended using belly support band. Also has pelvic PT scheduled.  - Has supplies for self cath if needed.  - Continue with miralax for constipation  Follow-up in 1 month    Marguerita Beards, MD  Time spent: I spent 15 minutes dedicated to the care of this patient on the date of this encounter to include pre-visit review of records, face-to-face time with the patient and post visit documentation. Additional time was spent on the pessary fitting.

## 2021-08-30 NOTE — Progress Notes (Signed)
   PRENATAL VISIT NOTE  Subjective:  Margaret Ryan is a 36 y.o. G3P2002 at [redacted]w[redacted]d being seen today for ongoing prenatal care.  She is currently monitored for the following issues for this high-risk pregnancy and has Chronic hypertension complicating pregnancy, antepartum; Supervision of high-risk pregnancy; Urinary retention; AMA (advanced maternal age) multigravida 35+; and Maternal morbid obesity, antepartum (HCC) on their problem list.  Patient reports no complaints.  Contractions: Not present. Vag. Bleeding: None.  Movement: Absent. Denies leaking of fluid.   The following portions of the patient's history were reviewed and updated as appropriate: allergies, current medications, past family history, past medical history, past social history, past surgical history and problem list.   Objective:   Vitals:   08/30/21 0812  BP: 119/68  Pulse: 78  Weight: 261 lb (118.4 kg)    Fetal Status:     Movement: Absent     General:  Alert, oriented and cooperative. Patient is in no acute distress.  Skin: Skin is warm and dry. No rash noted.   Cardiovascular: Normal heart rate noted  Respiratory: Normal respiratory effort, no problems with respiration noted  Abdomen: Soft, gravid, appropriate for gestational age.  Pain/Pressure: Absent     Pelvic: Cervical exam deferred        Extremities: Normal range of motion.  Edema: None  Mental Status: Normal mood and affect. Normal behavior. Normal judgment and thought content.   Assessment and Plan:  Pregnancy: G3P2002 at [redacted]w[redacted]d 1. Chronic hypertension complicating pregnancy, antepartum Stable BP, no meds. Continue ASA  2. Maternal morbid obesity, antepartum (HCC) TWG - 1 lb. Doing well.    3. Urinary retention She notes improvement of her symptoms but scheduled to get pessary today at Urogyn. Will continue to follow.  4. Multigravida of advanced maternal age in second trimester Low risk NIPS  5. [redacted] weeks gestation of pregnancy 6.  Supervision of high risk pregnancy in second trimester Scheduled for anatomy scan.  No other complaints or concerns.  Routine obstetric precautions reviewed.  Please refer to After Visit Summary for other counseling recommendations.   Return in about 4 weeks (around 09/27/2021) for OFFICE OB VISIT (MD only).  Future Appointments  Date Time Provider Department Center  08/30/2021 10:00 AM Marguerita Beards, MD [facility]  09/05/2021  8:30 AM Theressa Millard, PT [facility]  09/12/2021  3:00 PM Theressa Millard, PT [facility]  09/19/2021  8:30 AM Theressa Millard, PT [facility]  09/26/2021  8:30 AM Theressa Millard, PT [facility]  09/27/2021  8:35 AM Levie Heritage, DO [facility] None  09/27/2021 10:15 AM [facility] NURSE [facility]  09/27/2021 10:30 AM [facility] US3 [facility]  10/25/2021 10:15 AM Levie Heritage, DO [facility] None  11/22/2021  8:15 AM Adrian Blackwater, Rhona Raider, DO [facility] None    Jaynie Collins, MD

## 2021-09-03 DIAGNOSIS — Z419 Encounter for procedure for purposes other than remedying health state, unspecified: Secondary | ICD-10-CM | POA: Diagnosis not present

## 2021-09-04 NOTE — Therapy (Unsigned)
OUTPATIENT PHYSICAL THERAPY FEMALE PELVIC EVALUATION   Patient Name: Kezia Benevides MRN: [id number] DOB:1985-12-11, 36 y.o., female Today's Date: 09/05/2021   PT End of Session - 09/05/21 1233     Visit Number 1    Date for PT Re-Evaluation 11/28/21    Authorization Type [employer]    PT Start Time 0830    PT Stop Time 0930    PT Time Calculation (min) 60 min    Activity Tolerance Patient tolerated treatment well    Behavior During Therapy WFL for tasks assessed/performed             Past Medical History:  Diagnosis Date   Anxiety    Complication of anesthesia    She thinks itching after general anesth w/tonsillectomy   Hypertension    Past Surgical History:  Procedure Laterality Date   TONSILLECTOMY     Patient Active Problem List   Diagnosis Date Noted   AMA (advanced maternal age) multigravida 35+ 08/30/2021   Maternal morbid obesity, antepartum (HCC) 08/30/2021   Urinary retention 08/05/2021   Supervision of high-risk pregnancy 08/01/2021   Chronic hypertension complicating pregnancy, antepartum 10/27/2019    PCP: None  REFERRING PROVIDER: Marguerita Beards, MD  REFERRING DIAG: R33.9 (ICD-10-CM) - Incomplete bladder emptying  THERAPY DIAG:  Muscle weakness (generalized)  Other low back pain  Urinary retention  Rationale for Evaluation and Treatment Rehabilitation  ONSET DATE: 08/02/2021  SUBJECTIVE:                                                                                                                                                                                           SUBJECTIVE STATEMENT: Started during the pregnancy. Patient would have to force the urine out. Patient was not able to urinate and had to go to the hospital. I have some prolapse. I have not had a catheter for 2 weeks. I have been using a pessary and it is fine.  Fluid intake: Yes: water, coffee     PAIN:  Are you having pain? Yes NPRS scale: 5/10 Pain  location:  low back  Pain type: aching, sharp, and throbbing Pain description: constant   Aggravating factors: bending over, sitting at work Relieving factors: massage gun  PRECAUTIONS: Other: pregnant  WEIGHT BEARING RESTRICTIONS No  FALLS:  Has patient fallen in last 6 months? No  LIVING ENVIRONMENT: Lives with: lives with their family  OCCUPATION: Reception,   PLOF: Independent  PATIENT GOALS reduce back pain and reduce urinary retention   PERTINENT HISTORY:  pregnant  BOWEL MOVEMENT Pain with bowel movement: No Type of bowel movement:Type (Bristol Stool Scale) Type 4,  Frequency daily, and Strain Yes Fully empty rectum: Yes: may go several times Leakage: No Pads: No Fiber supplement: Yes: used miralax  URINATION Pain with urination: No Fully empty bladder: No Stream: Weak, difficulty with starting urine stream, dribbling after finishing Urgency: No Frequency: night time 2 times; average during the day Leakage: Coughing, Sneezing, and continuously, without sensation Pads: Yes: 2 during day, none at night  INTERCOURSE Pain with intercourse:  none  PREGNANCY Vaginal deliveries 2 Tearing Yes: internal tearing Currently pregnant Yes: due date 02/15/2022  PROLAPSE Cystocele stage 2    OBJECTIVE:   DIAGNOSTIC FINDINGS:  MD findings: high tone pelvic floor; Stage 2 of anterior wall  PATIENT SURVEYS:  PFIQ-7 33; UIQ-7 38  COGNITION:  Overall cognitive status: Within functional limits for tasks assessed     SENSATION:  Light touch: Appears intact  Proprioception: Appears intact   LUMBAR SPECIAL TESTS:  Trendelenburg sign: Negative bil.                POSTURE: rounded shoulders, forward head, and increased lumbar lordosis   PELVIC ALIGNMENT:  LUMBARAROM/PROM  A/PROM A/PROM  eval  Flexion Full with some tightness in th lumbar  Extension Decreased by 25%  Right lateral flexion Decreased by 25%  Left lateral flexion Decreased by 25%  Right  rotation Decreased by 25%  Left rotation Decreased by 25%   (Blank rows = not tested)  LOWER EXTREMITY ROM: bilateral hip ROM is full   LOWER EXTREMITY MMT:  MMT Right eval Left eval  Hip extension 4/5 4/5  Hip abduction 3+/5 5/5    PALPATION:   General  tenderness located in hip adductors, tightness along L3-L5                External Perineal Exam good coloring, firmness along the perineal body                             Internal Pelvic Floor tenderenss located on the levator ani, and along the urethra  Patient confirms identification and approves PT to assess internal pelvic floor and treatment Yes  PELVIC MMT:   MMT eval  Vaginal 2/5  (Blank rows = not tested)        TONE: increased  PROLAPSE: Anterior wall weakness  TODAY'S TREATMENT  EVAL Date: 09/05/2021 HEP established-see below    PATIENT EDUCATION:  Education details: Access Code: [email or web address]; massage pelvic floor with ball in sitting Person educated: Patient Education method: Explanation, Demonstration, Tactile cues, Verbal cues, and Handouts Education comprehension: verbalized understanding, returned demonstration, verbal cues required, tactile cues required, and needs further education   HOME EXERCISE PROGRAM: Access Code: [email or web address] URL: [email or web address] Date: 09/05/2021 Prepared by: Eulis Foster  Exercises - Seated Hamstring Stretch  - 1 x daily - 7 x weekly - 1 sets - 2 reps - 30 sec hold - Seated Piriformis Stretch with Trunk Bend  - 1 x daily - 7 x weekly - 1 sets - 2 reps - 30 sec hold - Seated Happy Baby With Trunk Flexion For Pelvic Relaxation  - 1 x daily - 7 x weekly - 1 sets - 1 reps - 15 sec hold - Side Lunge Adductor Stretch  - 1 x daily - 7 x weekly - 1 sets - 2 reps - 30 sec hold  ASSESSMENT:  CLINICAL IMPRESSION: Patient is a 36 y.o. female who was seen today for physical therapy evaluation and treatment for incomplete bladder  emptying. She is presently pregnant  and due on 02/15/2022. She had to have a  catheter  2 times due to not able to urinate and retention of urine.  She does not feel like she is able to empty her bladder fully. She will leak urine during the day without sensation, coughing and sneezing. She wears 2 pads during the day. She presently has constant low back pain al level 5/10 with bending over and sitting at work. Patient is using a pessary for her anterior wall weakness that is a grade 2. Lumbar ROM is decreased by 25% and has tightness in the L3-L5 area. Bilateral hip extension and abduction is weak. Pelvic floor strength is 2/5 holding for 3 seconds. She has tenderness located in the levator ani and introitus. She has tightness in the posterior vaginal canal and along the perineal body. Patient would benefit from our Have Less Pain, Gain Info, and be Healthy During Your Pregnancy class. Patient will benefit from skilled therapy to improve low back pain and reduce her urinary leakage while improving bladder emptying.    OBJECTIVE IMPAIRMENTS decreased coordination, decreased strength, increased fascial restrictions, impaired tone, and pain.   ACTIVITY LIMITATIONS lifting, bending, sitting, continence, and toileting  PARTICIPATION LIMITATIONS: cleaning, laundry, driving, shopping, community activity, and occupation  PERSONAL FACTORS Age, Fitness, and Transportation , presently pregnant are also affecting patient's functional outcome.   REHAB POTENTIAL: Excellent  CLINICAL DECISION MAKING: Stable/uncomplicated  EVALUATION COMPLEXITY: Low  By signing I understand that I am ordering/authorizing the patient to be a participant of the Have Less Pain, Gain Info, and Be Healthy During Your Pregnancy class as a component of this plan of care.   GOALS: Goals reviewed with patient? Yes  SHORT TERM GOALS: Target date: 10/03/2021  Independent with initial HEP Baseline:Not educated yet.  Goal status: INITIAL   LONG TERM GOALS: Target date:  11/28/2021   Patient is independent with advanced HEP for relaxing the pelvic floor strengthening and back exercises.  Baseline: Not educated yet Goal status: INITIAL  2.  Patient is able to fully empty her bladder so she does not have to use a catheter to remove the urine.  Baseline: Had to use a catheter 2 times and does not fully empty her bladder Goal status: INITIAL  3.  Pelvic floor strength >/= 3/5 so she has 50% reduction of urinary leakage.  Baseline: pelvic floor strength is 2/5 and has constant urinary leakage wearing 2 pads per day Goal status: INITIAL  4.  Back pain decreased </= 3/10 intermittently due to improved strength and mobility.  Baseline: pain level is constant at 5/10 Goal status: INITIAL   PLAN: PT FREQUENCY: 1x/week  PT DURATION: 12 weeks  PLANNED INTERVENTIONS: Therapeutic exercises, Therapeutic activity, Neuromuscular re-education, Patient/Family education, Self Care, Joint mobilization, Spinal mobilization, Cryotherapy, Moist heat, Taping, Biofeedback, and Manual therapy  PLAN FOR NEXT SESSION: Educate patient about the Pregnancy class that [facility]amin does, manual work to the lumbar and pelvic floor, pelvic floor drop, toileting with lifting her abdomen to empty her bladder.    Dina Warbington, PT 09/05/2021, 12:35 PM

## 2021-09-05 ENCOUNTER — Encounter: Payer: Self-pay | Admitting: Physical Therapy

## 2021-09-05 ENCOUNTER — Other Ambulatory Visit: Payer: Self-pay

## 2021-09-05 ENCOUNTER — Encounter: Payer: Medicaid Other | Attending: Obstetrics and Gynecology | Admitting: Physical Therapy

## 2021-09-05 DIAGNOSIS — M6281 Muscle weakness (generalized): Secondary | ICD-10-CM | POA: Diagnosis not present

## 2021-09-05 DIAGNOSIS — M5459 Other low back pain: Secondary | ICD-10-CM | POA: Diagnosis not present

## 2021-09-05 DIAGNOSIS — R339 Retention of urine, unspecified: Secondary | ICD-10-CM | POA: Diagnosis not present

## 2021-09-06 ENCOUNTER — Ambulatory Visit: Payer: Medicaid Other | Admitting: Obstetrics and Gynecology

## 2021-09-12 ENCOUNTER — Encounter: Payer: Self-pay | Admitting: Physical Therapy

## 2021-09-12 ENCOUNTER — Encounter: Payer: Medicaid Other | Admitting: Physical Therapy

## 2021-09-12 DIAGNOSIS — M5459 Other low back pain: Secondary | ICD-10-CM

## 2021-09-12 DIAGNOSIS — R339 Retention of urine, unspecified: Secondary | ICD-10-CM

## 2021-09-12 DIAGNOSIS — M6281 Muscle weakness (generalized): Secondary | ICD-10-CM | POA: Diagnosis not present

## 2021-09-12 NOTE — Therapy (Signed)
OUTPATIENT PHYSICAL THERAPY TREATMENT NOTE   Patient Name: Margaret Ryan MRN: [id number] DOB:22-Mar-1985, 36 y.o., female Today's Date: 09/12/2021  PCP: None REFERRING PROVIDER: Jaquita Folds, MD  END OF SESSION:   PT End of Session - 09/12/21 1510     Visit Number 2    Date for PT Re-Evaluation 11/28/21    Authorization Type [employer]    Authorization Time Period 10 visits, 09/05/2021-11/04/2021    Authorization - Visit Number 2    Authorization - Number of Visits 10    PT Start Time 1500    PT Stop Time 1545    PT Time Calculation (min) 45 min    Activity Tolerance Patient tolerated treatment well    Behavior During Therapy WFL for tasks assessed/performed             Past Medical History:  Diagnosis Date   Anxiety    Complication of anesthesia    She thinks itching after general anesth w/tonsillectomy   Hypertension    Past Surgical History:  Procedure Laterality Date   TONSILLECTOMY     Patient Active Problem List   Diagnosis Date Noted   AMA (advanced maternal age) multigravida 35+ 08/30/2021   Maternal morbid obesity, antepartum ([place]) 08/30/2021   Urinary retention 08/05/2021   Supervision of high-risk pregnancy 08/01/2021   Chronic hypertension complicating pregnancy, antepartum 10/27/2019  REFERRING DIAG: R33.9 (ICD-10-CM) - Incomplete bladder emptying   THERAPY DIAG:  Muscle weakness (generalized)   Other low back pain   Urinary retention   Rationale for Evaluation and Treatment Rehabilitation   ONSET DATE: 08/02/2021   SUBJECTIVE:                                                                                                                                                                                            SUBJECTIVE STATEMENT: I have not had issues since evaluation. I have tightness in my back.  No sifficulty with starting urine stream. Still leaking urine. Using the pessary.    PAIN:  Are you having pain? Yes NPRS  scale: 6/10 Pain location:  low back   Pain type: aching, sharp, and throbbing Pain description: constant    Aggravating factors: bending over, sitting at work Relieving factors: massage gun   PRECAUTIONS: Other: pregnant   WEIGHT BEARING RESTRICTIONS No   FALLS:  Has patient fallen in last 6 months? No   LIVING ENVIRONMENT: Lives with: lives with their family   OCCUPATION: Reception,    PLOF: Independent   PATIENT GOALS reduce back pain and reduce urinary retention     PERTINENT HISTORY:  pregnant  BOWEL MOVEMENT Pain with bowel movement: No Type of bowel movement:Type (Bristol Stool Scale) Type 4, Frequency daily, and Strain Yes Fully empty rectum: Yes: may go several times Leakage: No Pads: No Fiber supplement: Yes: used miralax   URINATION Pain with urination: No Fully empty bladder: No Stream: Weak, Urgency: No Frequency: night time 2 times; average during the day Leakage: Coughing, Sneezing, and continuously, without sensation Pads: Yes: 2 during day, none at night   INTERCOURSE Pain with intercourse:  none   PREGNANCY Vaginal deliveries 2 Tearing Yes: internal tearing Currently pregnant Yes: due date 02/15/2022   PROLAPSE Cystocele stage 2       OBJECTIVE:    DIAGNOSTIC FINDINGS:  MD findings: high tone pelvic floor; Stage 2 of anterior wall   PATIENT SURVEYS:  PFIQ-7 33; UIQ-7 38   COGNITION:            Overall cognitive status: Within functional limits for tasks assessed                          SENSATION:            Light touch: Appears intact            Proprioception: Appears intact     LUMBAR SPECIAL TESTS:  Trendelenburg sign: Negative bil.                 POSTURE: rounded shoulders, forward head, and increased lumbar lordosis               PELVIC ALIGNMENT:   LUMBARAROM/PROM   A/PROM A/PROM  eval  Flexion Full with some tightness in th lumbar  Extension Decreased by 25%  Right lateral flexion Decreased by 25%  Left  lateral flexion Decreased by 25%  Right rotation Decreased by 25%  Left rotation Decreased by 25%   (Blank rows = not tested)   LOWER EXTREMITY ROM: bilateral hip ROM is full     LOWER EXTREMITY MMT:   MMT Right eval Left eval  Hip extension 4/5 4/5  Hip abduction 3+/5 5/5     PALPATION:   General  tenderness located in hip adductors, tightness along L3-L5                 External Perineal Exam good coloring, firmness along the perineal body                             Internal Pelvic Floor tenderenss located on the levator ani, and along the urethra   Patient confirms identification and approves PT to assess internal pelvic floor and treatment Yes   PELVIC MMT:   MMT eval 09/12/2021  Vaginal 2/5 3/5  (Blank rows = not tested)         TONE: increased   PROLAPSE: Anterior wall weakness   TODAY'S TREATMENT  09/12/2021 Manual: Soft tissue mobilization:to lumbar paraspinals and along the SI joint in sitting  to reduce tightness Myofascial release:using a suction cup  on the lumbar area to release the fascia Internal pelvic floor; No emotional/communication barriers or cognitive limitation. Patient is motivated to learn. Patient understands and agrees with treatment goals and plan. PT explains patient will be examined in standing, sitting, and lying down to see how their muscles and joints work. When they are ready, they will be asked to remove their underwear so PT can examine their perineum. The patient is also given  the option of providing their own chaperone as one is not provided in our facility. The patient also has the right and is explained the right to defer or refuse any part of the evaluation or treatment including the internal exam. With the patient's consent, PT will use one gloved finger to gently assess the muscles of the pelvic floor, seeing how well it contracts and relaxes and if there is muscle symmetry. After, the patient will get dressed and PT and patient  will discuss exam findings and plan of care. PT and patient discuss plan of care, schedule, attendance policy and HEP activities.  Going through the vagina to work on the levator ani and posterior vaginal wall.  Neuromuscular re-education: Pelvic floor contraction training:pelvic floor contraction without bulging out and tightening her lower abdominals Self-care: Use a tennis ball to massage her back while driving 3 hours to [place].     EVAL Date: 09/05/2021 HEP established-see below      PATIENT EDUCATION:  09/12/2021 Education details: Access Code: [email or web address] educated: Patient Education method: Explanation, Demonstration, Actor cues, Verbal cues, and Handouts Education comprehension: verbalized understanding, returned demonstration, verbal cues required, tactile cues required, and needs further education     HOME EXERCISE PROGRAM: 09/12/2021  Access Code: [email or web address] URL: [email or web address] Date: 09/12/2021 Prepared by: Eulis Foster  Exercises - Seated Hamstring Stretch  - 1 x daily - 7 x weekly - 1 sets - 2 reps - 30 sec hold - Seated Piriformis Stretch with Trunk Bend  - 1 x daily - 7 x weekly - 1 sets - 2 reps - 30 sec hold - Seated Happy Baby With Trunk Flexion For Pelvic Relaxation  - 1 x daily - 7 x weekly - 1 sets - 1 reps - 15 sec hold - Side Lunge Adductor Stretch  - 1 x daily - 7 x weekly - 1 sets - 2 reps - 30 sec hold - Seated Pelvic Floor Contraction  - 3 x daily - 7 x weekly - 1 sets - 10 reps - 5 sec hold ASSESSMENT:   CLINICAL IMPRESSION: Patient is a 36 y.o. female who was seen today for physical therapy  treatment for incomplete bladder emptying. Pelvic floor strength increased to 3/5. She is able to contract the pelvic floor in sitting and supine. Patient had less trigger points in the pelvic floor after the manual work. She has not had issues with waiting to urinate since last visit.  Patient will benefit from skilled therapy to improve low  back pain and reduce her urinary leakage while improving bladder emptying.      OBJECTIVE IMPAIRMENTS decreased coordination, decreased strength, increased fascial restrictions, impaired tone, and pain.    ACTIVITY LIMITATIONS lifting, bending, sitting, continence, and toileting   PARTICIPATION LIMITATIONS: cleaning, laundry, driving, shopping, community activity, and occupation   PERSONAL FACTORS Age, Fitness, and Transportation , presently pregnant are also affecting patient's functional outcome.    REHAB POTENTIAL: Excellent   CLINICAL DECISION MAKING: Stable/uncomplicated   EVALUATION COMPLEXITY: Low   By signing I understand that I am ordering/authorizing the patient to be a participant of the Have Less Pain, Gain Info, and Be Healthy During Your Pregnancy class as a component of this plan of care.    GOALS: Goals reviewed with patient? Yes   SHORT TERM GOALS: Target date: 10/03/2021   Independent with initial HEP Baseline:Not educated yet.  Goal status: INITIAL     LONG TERM GOALS: Target date: 11/28/2021    Patient is independent  with advanced HEP for relaxing the pelvic floor strengthening and back exercises.  Baseline: Not educated yet Goal status: INITIAL   2.  Patient is able to fully empty her bladder so she does not have to use a catheter to remove the urine.  Baseline: Had to use a catheter 2 times and does not fully empty her bladder Goal status: INITIAL   3.  Pelvic floor strength >/= 3/5 so she has 50% reduction of urinary leakage.  Baseline: pelvic floor strength is 2/5 and has constant urinary leakage wearing 2 pads per day Goal status: INITIAL   4.  Back pain decreased </= 3/10 intermittently due to improved strength and mobility.  Baseline: pain level is constant at 5/10 Goal status: INITIAL     PLAN: PT FREQUENCY: 1x/week   PT DURATION: 12 weeks   PLANNED INTERVENTIONS: Therapeutic exercises, Therapeutic activity, Neuromuscular re-education,  Patient/Family education, Self Care, Joint mobilization, Spinal mobilization, Cryotherapy, Moist heat, Taping, Biofeedback, and Manual therapy   PLAN FOR NEXT SESSION:  manual work to the lumbar and pelvic floor, pelvic floor drop, toileting with lifting her abdomen to empty her bladder. Back strength      Earlie Counts, PT 09/12/21 4:07 PM

## 2021-09-17 ENCOUNTER — Encounter: Payer: Medicaid Other | Admitting: Physical Therapy

## 2021-09-19 ENCOUNTER — Encounter: Payer: Medicaid Other | Admitting: Physical Therapy

## 2021-09-26 ENCOUNTER — Encounter: Payer: Medicaid Other | Admitting: Physical Therapy

## 2021-09-27 ENCOUNTER — Ambulatory Visit: Payer: Medicaid Other | Attending: Family Medicine

## 2021-09-27 ENCOUNTER — Other Ambulatory Visit: Payer: Self-pay | Admitting: *Deleted

## 2021-09-27 ENCOUNTER — Encounter: Payer: Self-pay | Admitting: *Deleted

## 2021-09-27 ENCOUNTER — Ambulatory Visit (INDEPENDENT_AMBULATORY_CARE_PROVIDER_SITE_OTHER): Payer: Medicaid Other | Admitting: Family Medicine

## 2021-09-27 ENCOUNTER — Ambulatory Visit: Payer: Medicaid Other | Admitting: *Deleted

## 2021-09-27 VITALS — BP 128/75 | HR 73

## 2021-09-27 VITALS — BP 128/74 | HR 81 | Wt 267.0 lb

## 2021-09-27 DIAGNOSIS — O10919 Unspecified pre-existing hypertension complicating pregnancy, unspecified trimester: Secondary | ICD-10-CM

## 2021-09-27 DIAGNOSIS — O10012 Pre-existing essential hypertension complicating pregnancy, second trimester: Secondary | ICD-10-CM | POA: Insufficient documentation

## 2021-09-27 DIAGNOSIS — O99210 Obesity complicating pregnancy, unspecified trimester: Secondary | ICD-10-CM

## 2021-09-27 DIAGNOSIS — O10019 Pre-existing essential hypertension complicating pregnancy, unspecified trimester: Secondary | ICD-10-CM | POA: Diagnosis not present

## 2021-09-27 DIAGNOSIS — O09522 Supervision of elderly multigravida, second trimester: Secondary | ICD-10-CM

## 2021-09-27 DIAGNOSIS — O0992 Supervision of high risk pregnancy, unspecified, second trimester: Secondary | ICD-10-CM

## 2021-09-27 DIAGNOSIS — O283 Abnormal ultrasonic finding on antenatal screening of mother: Secondary | ICD-10-CM | POA: Insufficient documentation

## 2021-09-27 DIAGNOSIS — Z3480 Encounter for supervision of other normal pregnancy, unspecified trimester: Secondary | ICD-10-CM | POA: Diagnosis not present

## 2021-09-27 DIAGNOSIS — Z3A18 18 weeks gestation of pregnancy: Secondary | ICD-10-CM | POA: Diagnosis not present

## 2021-09-27 DIAGNOSIS — O99212 Obesity complicating pregnancy, second trimester: Secondary | ICD-10-CM

## 2021-09-27 DIAGNOSIS — O10912 Unspecified pre-existing hypertension complicating pregnancy, second trimester: Secondary | ICD-10-CM

## 2021-09-27 DIAGNOSIS — Z363 Encounter for antenatal screening for malformations: Secondary | ICD-10-CM | POA: Diagnosis not present

## 2021-09-27 DIAGNOSIS — Z362 Encounter for other antenatal screening follow-up: Secondary | ICD-10-CM

## 2021-09-27 DIAGNOSIS — Z3689 Encounter for other specified antenatal screening: Secondary | ICD-10-CM

## 2021-09-27 NOTE — Progress Notes (Signed)
   PRENATAL VISIT NOTE  Subjective:  Margaret Ryan is a 36 y.o. G3P2002 at [redacted]w[redacted]d being seen today for ongoing prenatal care.  She is currently monitored for the following issues for this high-risk pregnancy and has Chronic hypertension complicating pregnancy, antepartum; Supervision of high-risk pregnancy; Urinary retention; AMA (advanced maternal age) multigravida 35+; and Maternal morbid obesity, antepartum (HCC) on their problem list.  Patient reports  having improvement with urinary retention. She stopped using the pessary for now .  Contractions: Not present. Vag. Bleeding: None.  Movement: Present. Denies leaking of fluid.   The following portions of the patient's history were reviewed and updated as appropriate: allergies, current medications, past family history, past medical history, past social history, past surgical history and problem list.   Objective:   Vitals:   09/27/21 0818  BP: 128/74  Pulse: 81  Weight: 267 lb (121.1 kg)    Fetal Status: Fetal Heart Rate (bpm): 155   Movement: Present     General:  Alert, oriented and cooperative. Patient is in no acute distress.  Skin: Skin is warm and dry. No rash noted.   Cardiovascular: Normal heart rate noted  Respiratory: Normal respiratory effort, no problems with respiration noted  Abdomen: Soft, gravid, appropriate for gestational age.  Pain/Pressure: Absent     Pelvic: Cervical exam deferred        Extremities: Normal range of motion.  Edema: None  Mental Status: Normal mood and affect. Normal behavior. Normal judgment and thought content.   Assessment and Plan:  Pregnancy: G3P2002 at [redacted]w[redacted]d 1. Supervision of high risk pregnancy in second trimester FHT and FH normal - Babyscripts Schedule Optimization  2. Maternal morbid obesity, antepartum (HCC) Goal weight gain - 15#.  TWG - 5#.  3. [redacted] weeks gestation of pregnancy   4. Chronic hypertension complicating pregnancy, antepartum Controlled off  medication Continue ASA 81mg  Serial growth .  5. Multigravida of advanced maternal age in second trimester   Preterm labor symptoms and general obstetric precautions including but not limited to vaginal bleeding, contractions, leaking of fluid and fetal movement were reviewed in detail with the patient. Please refer to After Visit Summary for other counseling recommendations.   No follow-ups on file.  Future Appointments  Date Time Provider Department Center  09/27/2021 10:15 AM [facility] NURSE [facility]  09/27/2021 10:30 AM [facility]  10/04/2021  9:40 AM 12/04/2021, MD [facility]  10/08/2021  4:00 PM 12/08/2021, PT [facility]  10/22/2021  4:00 PM 10/24/2021, PT [facility]  10/25/2021  8:15 AM 10/27/2021, DO [facility] None  11/22/2021  8:15 AM 11/24/2021, DO [facility] None    Levie Heritage, DO

## 2021-10-04 ENCOUNTER — Ambulatory Visit: Payer: Medicaid Other | Admitting: Obstetrics and Gynecology

## 2021-10-04 DIAGNOSIS — Z419 Encounter for procedure for purposes other than remedying health state, unspecified: Secondary | ICD-10-CM | POA: Diagnosis not present

## 2021-10-08 ENCOUNTER — Encounter: Payer: Self-pay | Admitting: Physical Therapy

## 2021-10-08 ENCOUNTER — Encounter: Payer: Medicaid Other | Attending: Obstetrics and Gynecology | Admitting: Physical Therapy

## 2021-10-08 DIAGNOSIS — M6281 Muscle weakness (generalized): Secondary | ICD-10-CM | POA: Diagnosis not present

## 2021-10-08 DIAGNOSIS — M5459 Other low back pain: Secondary | ICD-10-CM | POA: Diagnosis not present

## 2021-10-08 DIAGNOSIS — R339 Retention of urine, unspecified: Secondary | ICD-10-CM | POA: Insufficient documentation

## 2021-10-08 NOTE — Patient Instructions (Addendum)
Urge Incontinence  Ideal urination frequency is every 2-4 wakeful hours, which equates to 5-8 times within a 24-hour period.   Urge incontinence is leakage that occurs when the bladder muscle contracts, creating a sudden need to go before getting to the bathroom.   Going too often when your bladder isn't actually full can disrupt the body's automatic signals to store and hold urine longer, which will increase urgency/frequency.  In this case, the bladder "is running the show" and strategies can be learned to retrain this pattern.   One should be able to control the first urge to urinate, at around .  The bladder can hold up to a "grande latte," or . To help you gain control, practice the Urge Drill below when urgency strikes.  This drill will help retrain your bladder signals and allow you to store and hold urine longer.  The overall goal is to stretch out your time between voids to reach a more manageable voiding schedule.    Practice your "quick flicks" often throughout the day (each waking hour) even when you don't need feel the urge to go.  This will help strengthen your pelvic floor muscles, making them more effective in controlling leakage.  Urge Drill  When you feel an urge to go, follow these steps to regain control: Stop what you are doing and be still Take one deep breath, directing your air into your abdomen Think an affirming thought, such as "I've got this." Do 5 quick flicks of your pelvic floor Go up and down on your heels in sitting or standing.  Walk with control to the bathroom to void, or delay voiding  Eulis Foster, PT [facility] [address], [address] [place], [place] [address] W: [phone] [email or web address]

## 2021-10-08 NOTE — Therapy (Signed)
OUTPATIENT PHYSICAL THERAPY TREATMENT NOTE   Patient Name: Margaret Ryan MRN: [id number] DOB:08-Jun-1985, 36 y.o., female Today's Date: 10/08/2021  PCP: None REFERRING PROVIDER: Jaquita Folds, MD  END OF SESSION:   PT End of Session - 10/08/21 1554     Visit Number 3    Date for PT Re-Evaluation 11/28/21    Authorization Type [employer]    Authorization Time Period 10 visits, 09/05/2021-11/04/2021    Authorization - Visit Number 3    Authorization - Number of Visits 10    PT Start Time 4098    PT Stop Time 1640    PT Time Calculation (min) 46 min    Activity Tolerance Patient tolerated treatment well    Behavior During Therapy WFL for tasks assessed/performed             Past Medical History:  Diagnosis Date   Anxiety    Complication of anesthesia    She thinks itching after general anesth w/tonsillectomy   Hypertension    Past Surgical History:  Procedure Laterality Date   TONSILLECTOMY     Patient Active Problem List   Diagnosis Date Noted   AMA (advanced maternal age) multigravida 35+ 08/30/2021   Maternal morbid obesity, antepartum ([place]) 08/30/2021   Urinary retention 08/05/2021   Supervision of high-risk pregnancy 08/01/2021   Chronic hypertension complicating pregnancy, antepartum 10/27/2019   REFERRING DIAG: R33.9 (ICD-10-CM) - Incomplete bladder emptying   THERAPY DIAG:  Muscle weakness (generalized)   Other low back pain   Urinary retention   Rationale for Evaluation and Treatment Rehabilitation   ONSET DATE: 08/02/2021   SUBJECTIVE:                                                                                                                                                                                            SUBJECTIVE STATEMENT: I do not feel like I am emptying my bladder all the way.   PAIN:  Are you having pain? Yes NPRS scale: 6/10 Pain location:  low back   Pain type: aching, sharp, and throbbing Pain  description: constant    Aggravating factors: bending over, sitting at work Relieving factors: massage gun   PRECAUTIONS: Other: pregnant   WEIGHT BEARING RESTRICTIONS No   FALLS:  Has patient fallen in last 6 months? No   LIVING ENVIRONMENT: Lives with: lives with their family   OCCUPATION: Reception,    PLOF: Independent   PATIENT GOALS reduce back pain and reduce urinary retention     PERTINENT HISTORY:  pregnant   BOWEL MOVEMENT Pain with bowel movement: No Type of bowel movement:Type ([place] Stool  Scale) Type 4, Frequency daily, and Strain Yes Fully empty rectum: Yes: may go several times Leakage: No Pads: No Fiber supplement: Yes: used miralax   URINATION Pain with urination: No Fully empty bladder: No Stream: Weak, Urgency: No Frequency: night time 2 times; average during the day Leakage: Coughing, Sneezing, and continuously, without sensation Pads: Yes: 2 during day, none at night   INTERCOURSE Pain with intercourse:  none   PREGNANCY Vaginal deliveries 2 Tearing Yes: internal tearing Currently pregnant Yes: due date 02/15/2022   PROLAPSE Cystocele stage 2       OBJECTIVE:    DIAGNOSTIC FINDINGS:  MD findings: high tone pelvic floor; Stage 2 of anterior wall   PATIENT SURVEYS:  PFIQ-7 33; UIQ-7 38   COGNITION:            Overall cognitive status: Within functional limits for tasks assessed                          SENSATION:            Light touch: Appears intact            Proprioception: Appears intact     LUMBAR SPECIAL TESTS:  Trendelenburg sign: Negative bil.                 POSTURE: rounded shoulders, forward head, and increased lumbar lordosis               PELVIC ALIGNMENT:   LUMBARAROM/PROM   A/PROM A/PROM  eval  Flexion Full with some tightness in th lumbar  Extension Decreased by 25%  Right lateral flexion Decreased by 25%  Left lateral flexion Decreased by 25%  Right rotation Decreased by 25%  Left rotation  Decreased by 25%   (Blank rows = not tested)   LOWER EXTREMITY ROM: bilateral hip ROM is full     LOWER EXTREMITY MMT:   MMT Right eval Left eval  Hip extension 4/5 4/5  Hip abduction 3+/5 5/5     PALPATION:   General  tenderness located in hip adductors, tightness along L3-L5                 External Perineal Exam good coloring, firmness along the perineal body                             Internal Pelvic Floor tenderenss located on the levator ani, and along the urethra   Patient confirms identification and approves PT to assess internal pelvic floor and treatment Yes   PELVIC MMT:   MMT eval 09/12/2021  Vaginal 2/5 3/5  (Blank rows = not tested)         TONE: increased   PROLAPSE: Anterior wall weakness   TODAY'S TREATMENT  10/08/2021 Neuromuscular re-education: Pelvic floor contraction training:sitting pelvic floor contraction holding 5 sec 10x then end off with 5 quick flicks.   Therapeutic activities: Functional strengthening activities:educated patient on not twisting with her house work to decrease strain on her back and pelvic floor, using her legs with lifting instead of her back, sweeping and vacuuming without twisting her spine. She was able to returen demonstration correctly. Educated patient to lift her belly up to assist in fully emptying her bladder.  Self-care: Educated patient on the urge to void drill, when to use it and how to read her body that she really has to go or it is  only a pressure.  Educated patient on the baby belly band and how to wear it and what kind of support it gives.     09/12/2021 Manual: Soft tissue mobilization:to lumbar paraspinals and along the SI joint in sitting  to reduce tightness Myofascial release:using a suction cup  on the lumbar area to release the fascia Internal pelvic floor; No emotional/communication barriers or cognitive limitation. Patient is motivated to learn. Patient understands and agrees with treatment goals  and plan. PT explains patient will be examined in standing, sitting, and lying down to see how their muscles and joints work. When they are ready, they will be asked to remove their underwear so PT can examine their perineum. The patient is also given the option of providing their own chaperone as one is not provided in our facility. The patient also has the right and is explained the right to defer or refuse any part of the evaluation or treatment including the internal exam. With the patient's consent, PT will use one gloved finger to gently assess the muscles of the pelvic floor, seeing how well it contracts and relaxes and if there is muscle symmetry. After, the patient will get dressed and PT and patient will discuss exam findings and plan of care. PT and patient discuss plan of care, schedule, attendance policy and HEP activities.  Going through the vagina to work on the levator ani and posterior vaginal wall.  Neuromuscular re-education: Pelvic floor contraction training:pelvic floor contraction without bulging out and tightening her lower abdominals Self-care: Use a tennis ball to massage her back while driving 3 hours to [place].     EVAL Date: 09/05/2021 HEP established-see below      PATIENT EDUCATION:  09/12/2021 Education details: Access Code: [email or web address] Person educated: Patient Education method: Explanation, Demonstration, Tactile cues, Verbal cues, and Handouts Education comprehension: verbalized understanding, returned demonstration, verbal cues required, tactile cues required, and needs further education     HOME EXERCISE PROGRAM: 09/12/2021  Access Code: [email or web address] URL: [email or web address] Date: 09/12/2021 Prepared by: Earlie Counts   Exercises - Seated Hamstring Stretch  - 1 x daily - 7 x weekly - 1 sets - 2 reps - 30 sec hold - Seated Piriformis Stretch with Trunk Bend  - 1 x daily - 7 x weekly - 1 sets - 2 reps - 30 sec hold - Seated Happy Baby With Trunk  Flexion For Pelvic Relaxation  - 1 x daily - 7 x weekly - 1 sets - 1 reps - 15 sec hold - Side Lunge Adductor Stretch  - 1 x daily - 7 x weekly - 1 sets - 2 reps - 30 sec hold - Seated Pelvic Floor Contraction  - 3 x daily - 7 x weekly - 1 sets - 10 reps - 5 sec hold ASSESSMENT:   CLINICAL IMPRESSION: Patient is a 36 y.o. female who was seen today for physical therapy  treatment for incomplete bladder emptying. Pelvic floor strength increased to 3/5. She is able to contract the pelvic floor in sitting and supine. She understands to go to the bathroom when she has the urge to void and not listen to the pressure. She still has trouble feeling when she leaks urine at times.  The Baby Belly Band gave her some support on her back and took some pressure off the  pelvic floor. She has trouble keeping up with the exercises so went over thepelvic floro exercise which is important. Patient will benefit from skilled therapy to improve low  back pain and reduce her urinary leakage while improving bladder emptying.      OBJECTIVE IMPAIRMENTS decreased coordination, decreased strength, increased fascial restrictions, impaired tone, and pain.    ACTIVITY LIMITATIONS lifting, bending, sitting, continence, and toileting   PARTICIPATION LIMITATIONS: cleaning, laundry, driving, shopping, community activity, and occupation   PERSONAL FACTORS Age, Fitness, and Transportation , presently pregnant are also affecting patient's functional outcome.    REHAB POTENTIAL: Excellent   CLINICAL DECISION MAKING: Stable/uncomplicated   EVALUATION COMPLEXITY: Low   By signing I understand that I am ordering/authorizing the patient to be a participant of the Have Less Pain, Gain Info, and Be Healthy During Your Pregnancy class as a component of this plan of care.    GOALS: Goals reviewed with patient? Yes   SHORT TERM GOALS: Target date: 10/03/2021   Independent with initial HEP Baseline:Not educated yet.  Goal status:  Met 10/08/2021    LONG TERM GOALS: Target date: 11/28/2021    Patient is independent with advanced HEP for relaxing the pelvic floor strengthening and back exercises.  Baseline: Not educated yet Goal status: INITIAL   2.  Patient is able to fully empty her bladder so she does not have to use a catheter to remove the urine.  Baseline: Had to use a catheter 2 times and does not fully empty her bladder Goal status: INITIAL   3.  Pelvic floor strength >/= 3/5 so she has 50% reduction of urinary leakage.  Baseline: pelvic floor strength is 2/5 and has constant urinary leakage wearing 2 pads per day Goal status: INITIAL   4.  Back pain decreased </= 3/10 intermittently due to improved strength and mobility.  Baseline: pain level is constant at 5/10 Goal status: INITIAL     PLAN: PT FREQUENCY: 1x/week   PT DURATION: 12 weeks   PLANNED INTERVENTIONS: Therapeutic exercises, Therapeutic activity, Neuromuscular re-education, Patient/Family education, Self Care, Joint mobilization, Spinal mobilization, Cryotherapy, Moist heat, Taping, Biofeedback, and Manual therapy   PLAN FOR NEXT SESSION:  manual work to the lumbar and pelvic floor,   Earlie Counts, PT 10/08/21 4:46 PM

## 2021-10-22 ENCOUNTER — Encounter: Payer: Self-pay | Admitting: Physical Therapy

## 2021-10-25 ENCOUNTER — Telehealth (INDEPENDENT_AMBULATORY_CARE_PROVIDER_SITE_OTHER): Payer: Medicaid Other | Admitting: Family Medicine

## 2021-10-25 ENCOUNTER — Ambulatory Visit: Payer: Medicaid Other | Admitting: *Deleted

## 2021-10-25 ENCOUNTER — Ambulatory Visit: Payer: Medicaid Other | Attending: Obstetrics

## 2021-10-25 VITALS — BP 122/73 | HR 84

## 2021-10-25 DIAGNOSIS — O0992 Supervision of high risk pregnancy, unspecified, second trimester: Secondary | ICD-10-CM

## 2021-10-25 DIAGNOSIS — Z362 Encounter for other antenatal screening follow-up: Secondary | ICD-10-CM | POA: Insufficient documentation

## 2021-10-25 DIAGNOSIS — O10912 Unspecified pre-existing hypertension complicating pregnancy, second trimester: Secondary | ICD-10-CM

## 2021-10-25 DIAGNOSIS — O10919 Unspecified pre-existing hypertension complicating pregnancy, unspecified trimester: Secondary | ICD-10-CM

## 2021-10-25 DIAGNOSIS — O99210 Obesity complicating pregnancy, unspecified trimester: Secondary | ICD-10-CM | POA: Diagnosis not present

## 2021-10-25 DIAGNOSIS — Z3689 Encounter for other specified antenatal screening: Secondary | ICD-10-CM | POA: Diagnosis not present

## 2021-10-25 DIAGNOSIS — O09529 Supervision of elderly multigravida, unspecified trimester: Secondary | ICD-10-CM

## 2021-10-25 DIAGNOSIS — O99212 Obesity complicating pregnancy, second trimester: Secondary | ICD-10-CM

## 2021-10-25 DIAGNOSIS — O09522 Supervision of elderly multigravida, second trimester: Secondary | ICD-10-CM | POA: Insufficient documentation

## 2021-10-25 DIAGNOSIS — Z3A22 22 weeks gestation of pregnancy: Secondary | ICD-10-CM

## 2021-10-25 NOTE — Progress Notes (Signed)
   PRENATAL VISIT NOTE  Subjective:  Margaret Ryan is a 36 y.o. G3P2002 at [redacted]w[redacted]d being seen today for ongoing prenatal care.  She is currently monitored for the following issues for this high-risk pregnancy and has Chronic hypertension complicating pregnancy, antepartum; Supervision of high-risk pregnancy; Urinary retention; AMA (advanced maternal age) multigravida 35+; and Maternal morbid obesity, antepartum ([place]) on their problem list.  Patient reports  decreased appetite .   . Vag. Bleeding: None.  Movement: Present. Denies leaking of fluid.   The following portions of the patient's history were reviewed and updated as appropriate: allergies, current medications, past family history, past medical history, past social history, past surgical history and problem list.   Objective:  There were no vitals filed for this visit.  Fetal Status:     Movement: Present     General:  Alert, oriented and cooperative. Patient is in no acute distress.  Skin: Skin is warm and dry. No rash noted.   Cardiovascular: Normal heart rate noted  Respiratory: Normal respiratory effort, no problems with respiration noted  Abdomen: Soft, gravid, appropriate for gestational age.        Pelvic: Cervical exam deferred        Extremities: Normal range of motion.  Edema: None  Mental Status: Normal mood and affect. Normal behavior. Normal judgment and thought content.   Assessment and Plan:  Pregnancy: G3P2002 at [redacted]w[redacted]d 1. Supervision of high risk pregnancy in second trimester FHT and FH  2. Maternal morbid obesity, antepartum ([place])  3. Antepartum multigravida of advanced maternal age On ASA 81mg   4. Chronic hypertension complicating pregnancy, antepartum [place] today for growth On ASA 81mg  Not on medications  Preterm labor symptoms and general obstetric precautions including but not limited to vaginal bleeding, contractions, leaking of fluid and fetal movement were reviewed in detail with the  patient. Please refer to After Visit Summary for other counseling recommendations.   No follow-ups on file.  Future Appointments  Date Time Provider [place]  10/25/2021  3:15 PM [facility] NURSE [facility]  10/25/2021  3:30 PM [facility] US2 [facility]  11/11/2021  4:15 PM Monico Hoar, PT [facility] None  11/22/2021  8:15 AM Truett Mainland, DO [facility] None  11/22/2021  3:20 PM Jaquita Folds, MD [facility]  11/27/2021  4:15 PM Monico Hoar, PT [facility] None    Truett Mainland, DO

## 2021-10-28 ENCOUNTER — Other Ambulatory Visit: Payer: Self-pay | Admitting: *Deleted

## 2021-10-28 DIAGNOSIS — R638 Other symptoms and signs concerning food and fluid intake: Secondary | ICD-10-CM

## 2021-10-28 DIAGNOSIS — O99212 Obesity complicating pregnancy, second trimester: Secondary | ICD-10-CM

## 2021-11-03 DIAGNOSIS — Z419 Encounter for procedure for purposes other than remedying health state, unspecified: Secondary | ICD-10-CM | POA: Diagnosis not present

## 2021-11-11 ENCOUNTER — Encounter: Payer: Self-pay | Admitting: Physical Therapy

## 2021-11-11 ENCOUNTER — Ambulatory Visit: Payer: Medicaid Other | Attending: Obstetrics and Gynecology | Admitting: Physical Therapy

## 2021-11-11 DIAGNOSIS — R339 Retention of urine, unspecified: Secondary | ICD-10-CM | POA: Diagnosis not present

## 2021-11-11 DIAGNOSIS — M6281 Muscle weakness (generalized): Secondary | ICD-10-CM | POA: Insufficient documentation

## 2021-11-11 DIAGNOSIS — M5459 Other low back pain: Secondary | ICD-10-CM

## 2021-11-11 DIAGNOSIS — M545 Low back pain, unspecified: Secondary | ICD-10-CM | POA: Diagnosis not present

## 2021-11-11 NOTE — Patient Instructions (Signed)
Bladder Irritants  Certain foods and beverages can be irritating to the bladder.  Avoiding these irritants may decrease your symptoms of urinary urgency, frequency or bladder pain.  Even reducing your intake can help with your symptoms.  Not everyone is sensitive to all bladder irritants, so you may consider focusing on one irritant at a time, removing or reducing your intake of that irritant for 7-10 days to see if this change helps your symptoms.  Water intake is also very important.  Below is a list of bladder irritants.  Drinks: alcohol, carbonated beverages, caffeinated beverages such as coffee and tea, drinks with artificial sweeteners, citrus juices, apple juice, tomato juice  Foods: tomatoes and tomato based foods, spicy food, sugar and artificial sweeteners, vinegar, chocolate, raw onion, apples, citrus fruits, pineapple, cranberries, tomatoes, strawberries, plums, peaches, cantaloupe  Other: acidic urine (too concentrated) - see water intake info below  Substitutes you can try that are NOT irritating to the bladder: cooked onion, pears, papayas, sun-brewed decaf teas, watermelons, non-citrus herbal teas, apricots, kava and low-acid instant drinks (Postum).    WATER INTAKE: Remember to drink lots of water (aim for fluid intake of half your body weight with 2/3 of fluids being water).  You may be limiting fluids due to fear of leakage, but this can actually worsen urgency symptoms due to highly concentrated urine.  Water helps balance the pH of your urine so it doesn't become too acidic - acidic urine is a bladder irritant  [facility] [address], [address] [place], [place] [address] Phone # [phone] Fax [phone]  

## 2021-11-11 NOTE — Therapy (Addendum)
OUTPATIENT PHYSICAL THERAPY TREATMENT NOTE   Patient Name: Margaret Ryan MRN: [id number] DOB:1985-02-17, 36 y.o., female Today's Date: 11/11/2021  PCP: None REFERRING PROVIDER: Jaquita Folds, MD  END OF SESSION:   PT End of Session - 11/11/21 1615     Visit Number 4    Date for PT Re-Evaluation 11/28/21    Authorization Type [employer]    PT Start Time 1620    PT Stop Time 1655    PT Time Calculation (min) 35 min    Activity Tolerance Patient tolerated treatment well    Behavior During Therapy WFL for tasks assessed/performed             Past Medical History:  Diagnosis Date   Anxiety    Complication of anesthesia    She thinks itching after general anesth w/tonsillectomy   Hypertension    Past Surgical History:  Procedure Laterality Date   TONSILLECTOMY     Patient Active Problem List   Diagnosis Date Noted   AMA (advanced maternal age) multigravida 35+ 08/30/2021   Maternal morbid obesity, antepartum ([place]) 08/30/2021   Urinary retention 08/05/2021   Supervision of high-risk pregnancy 08/01/2021   Chronic hypertension complicating pregnancy, antepartum 10/27/2019   REFERRING DIAG: R33.9 (ICD-10-CM) - Incomplete bladder emptying   THERAPY DIAG:  Muscle weakness (generalized)   Other low back pain   Urinary retention   Rationale for Evaluation and Treatment Rehabilitation   ONSET DATE: 08/02/2021   SUBJECTIVE:                                                                                                                                                                                            SUBJECTIVE STATEMENT: I am able to empty my bladder better and fully.   PAIN:  Are you having pain? Yes NPRS scale: 6/10 Pain location:  low back   Pain type: aching, sharp, and throbbing Pain description: intermittent   Aggravating factors: bending over, sitting at work Relieving factors: massage gun   PRECAUTIONS: Other: pregnant    WEIGHT BEARING RESTRICTIONS No   FALLS:  Has patient fallen in last 6 months? No   LIVING ENVIRONMENT: Lives with: lives with their family   OCCUPATION: Reception,    PLOF: Independent   PATIENT GOALS reduce back pain and reduce urinary retention     PERTINENT HISTORY:  pregnant   BOWEL MOVEMENT Pain with bowel movement: No Type of bowel movement:Type (Bristol Stool Scale) Type 4, Frequency daily, and Strain Yes Fully empty rectum: Yes: may go several times Leakage: No Pads: No Fiber supplement: Yes: used miralax   URINATION Pain with  urination: No Fully empty bladder: yes Stream: Weak, Urgency: No Frequency: night time 2 times; average during the day Leakage: Coughing, Sneezing, and continuously, without sensation Pads: Yes: 2 during day, none at night   INTERCOURSE Pain with intercourse:  none   PREGNANCY Vaginal deliveries 2 Tearing Yes: internal tearing Currently pregnant Yes: due date 02/15/2022   PROLAPSE Cystocele stage 2       OBJECTIVE:    DIAGNOSTIC FINDINGS:  MD findings: high tone pelvic floor; Stage 2 of anterior wall   PATIENT SURVEYS:  PFIQ-7 39; UIQ-7 43   COGNITION:            Overall cognitive status: Within functional limits for tasks assessed                          SENSATION:            Light touch: Appears intact            Proprioception: Appears intact     LUMBAR SPECIAL TESTS:  Trendelenburg sign: Negative bil.                 POSTURE: rounded shoulders, forward head, and increased lumbar lordosis               PELVIC ALIGNMENT:Correct alignment   LUMBARAROM/PROM   A/PROM A/PROM  eval 11/11/2021  Flexion Full with some tightness in th lumbar full  Extension Decreased by 25% full  Right lateral flexion Decreased by 25% full  Left lateral flexion Decreased by 25% full  Right rotation Decreased by 25% full  Left rotation Decreased by 25% full   (Blank rows = not tested)   LOWER EXTREMITY ROM: bilateral hip ROM is  full     LOWER EXTREMITY MMT:   MMT Right eval Left eval Right 11/11/2021 Left  11/11/2021  Hip extension 4/5 4/5 5/5 5/5  Hip abduction 3+/5 5/5 5/5 5/5     PALPATION:   General  tenderness located in hip adductors, tightness along L3-L5                 External Perineal Exam good coloring, firmness along the perineal body                             Internal Pelvic Floor No tenderness in the pelvic floor.   Patient confirms identification and approves PT to assess internal pelvic floor and treatment Yes   PELVIC MMT:   MMT eval 09/12/2021 11/11/2021  Vaginal 2/5 3/5 3/5 after manual work  (Blank rows = not tested)         TONE: increased   PROLAPSE: Anterior wall weakness   TODAY'S TREATMENT  11/11/2021 Manual: Internal pelvic floor techniques:No emotional/communication barriers or cognitive limitation. Patient is motivated to learn. Patient understands and agrees with treatment goals and plan. PT explains patient will be examined in standing, sitting, and lying down to see how their muscles and joints work. When they are ready, they will be asked to remove their underwear so PT can examine their perineum. The patient is also given the option of providing their own chaperone as one is not provided in our facility. The patient also has the right and is explained the right to defer or refuse any part of the evaluation or treatment including the internal exam. With the patient's consent, PT will use one gloved finger to gently assess the  muscles of the pelvic floor, seeing how well it contracts and relaxes and if there is muscle symmetry. After, the patient will get dressed and PT and patient will discuss exam findings and plan of care. PT and patient discuss plan of care, schedule, attendance policy and HEP activities Therapist going into the vagina working on the introitus along the puborectalis, along the levator ani to assist the pelvic floor muscles to contract better.    Neuromuscular re-education: Pelvic floor contraction training:Therapist finger in the vaginal canal with pelvic floor contraction and hold for 5 sec. Patient initial contraction was 2/5. After tactile cues to the muscles to contract the verbal cues to lift the pelvic floor her strength went to 3/5.  Self-care: Educated patient on bladder irritants and how they can increase urinary leakage. Patient sips on coffee during the day and drink water at night. Dicussed with her drinking more water during the day.     10/08/2021 Neuromuscular re-education: Pelvic floor contraction training:sitting pelvic floor contraction holding 5 sec 10x then end off with 5 quick flicks.    Therapeutic activities: Functional strengthening activities:educated patient on not twisting with her house work to decrease strain on her back and pelvic floor, using her legs with lifting instead of her back, sweeping and vacuuming without twisting her spine. She was able to returen demonstration correctly. Educated patient to lift her belly up to assist in fully emptying her bladder.  Self-care: Educated patient on the urge to void drill, when to use it and how to read her body that she really has to go or it is only a pressure.  Educated patient on the baby belly band and how to wear it and what kind of support it gives.     09/12/2021 Manual: Soft tissue mobilization:to lumbar paraspinals and along the SI joint in sitting  to reduce tightness Myofascial release:using a suction cup  on the lumbar area to release the fascia Internal pelvic floor; No emotional/communication barriers or cognitive limitation. Patient is motivated to learn. Patient understands and agrees with treatment goals and plan. PT explains patient will be examined in standing, sitting, and lying down to see how their muscles and joints work. When they are ready, they will be asked to remove their underwear so PT can examine their perineum. The patient is also  given the option of providing their own chaperone as one is not provided in our facility. The patient also has the right and is explained the right to defer or refuse any part of the evaluation or treatment including the internal exam. With the patient's consent, PT will use one gloved finger to gently assess the muscles of the pelvic floor, seeing how well it contracts and relaxes and if there is muscle symmetry. After, the patient will get dressed and PT and patient will discuss exam findings and plan of care. PT and patient discuss plan of care, schedule, attendance policy and HEP activities.  Going through the vagina to work on the levator ani and posterior vaginal wall.  Neuromuscular re-education: Pelvic floor contraction training:pelvic floor contraction without bulging out and tightening her lower abdominals Self-care: Use a tennis ball to massage her back while driving 3 hours to [place].     EVAL Date: 09/05/2021 HEP established-see below      PATIENT EDUCATION:  11/11/2021 Education details: Access Code: [email or web address], educated patient on bladder irritants Person educated: Patient Education method: Explanation, Demonstration, Tactile cues, Verbal cues, and Handouts Education comprehension: verbalized understanding, returned demonstration, verbal  cues required, tactile cues required, and needs further education     HOME EXERCISE PROGRAM: 11/11/2021/2023  Access Code: [email or web address] URL: [email or web address] Date: 09/12/2021 Prepared by: Earlie Counts   Exercises - Seated Hamstring Stretch  - 1 x daily - 7 x weekly - 1 sets - 2 reps - 30 sec hold - Seated Piriformis Stretch with Trunk Bend  - 1 x daily - 7 x weekly - 1 sets - 2 reps - 30 sec hold - Seated Happy Baby With Trunk Flexion For Pelvic Relaxation  - 1 x daily - 7 x weekly - 1 sets - 1 reps - 15 sec hold - Side Lunge Adductor Stretch  - 1 x daily - 7 x weekly - 1 sets - 2 reps - 30 sec hold - Seated Pelvic Floor  Contraction  - 3 x daily - 7 x weekly - 1 sets - 10 reps - 5 sec hold ASSESSMENT:   CLINICAL IMPRESSION: Patient is a 36 y.o. female who was seen today for physical therapy  treatment for incomplete bladder emptying. Pelvic floor strength increased to 3/5. She is able to contract the pelvic floor in sitting and supine. She understands to go to the bathroom when she has the urge to void and not listen to the pressure. She still has trouble feeling when she leaks urine at times.  Patient has stopped using the pessary due to difficulty while she is pregnant. Patient has full lumbar ROM now and bilateral hip strength is 5/5.  Pelvic floor intact score is as follows: PFIQ-7 39; UIQ-7 43. Her back pain is 6/10 and is intermittent instead of constant. Patient will benefit from skilled therapy to improve low back pain and reduce her urinary leakage while improving bladder emptying.      OBJECTIVE IMPAIRMENTS decreased coordination, decreased strength, increased fascial restrictions, impaired tone, and pain.    ACTIVITY LIMITATIONS lifting, bending, sitting, continence, and toileting   PARTICIPATION LIMITATIONS: cleaning, laundry, driving, shopping, community activity, and occupation   PERSONAL FACTORS Age, Fitness, and Transportation , presently pregnant are also affecting patient's functional outcome.    REHAB POTENTIAL: Excellent   CLINICAL DECISION MAKING: Stable/uncomplicated   EVALUATION COMPLEXITY: Low   By signing I understand that I am ordering/authorizing the patient to be a participant of the Have Less Pain, Gain Info, and Be Healthy During Your Pregnancy class as a component of this plan of care.    GOALS: Goals reviewed with patient? Yes   SHORT TERM GOALS: Target date: 10/03/2021   Independent with initial HEP Baseline:Not educated yet.  Goal status: Met 10/08/2021    LONG TERM GOALS: Target date: 11/28/2021    Patient is independent with advanced HEP for relaxing the pelvic floor  strengthening and back exercises.  Baseline: Not educated yet Goal status: ongoing 11/11/2021   2.  Patient is able to fully empty her bladder so she does not have to use a catheter to remove the urine.  Baseline: Had to use a catheter 2 times and does not fully empty her bladder Goal status: met 11/11/2021   3.  Pelvic floor strength >/= 3/5 so she has 50% reduction of urinary leakage.  Baseline: pelvic floor strength is 2/5 and has constant urinary leakage wearing 2 pads per day Goal status: ongoing 11/11/2021   4.  Back pain decreased </= 3/10 intermittently due to improved strength and mobility.  Baseline: pain level is constant at 5/10 Goal status: ongoing 11/11/2021     PLAN: PT FREQUENCY:  1x/week for 6 visits   PT DURATION: 12 weeks   PLANNED INTERVENTIONS: Therapeutic exercises, Therapeutic activity, Neuromuscular re-education, Patient/Family education, Self Care, Joint mobilization, Spinal mobilization, Cryotherapy, Moist heat, Taping, Biofeedback, and Manual therapy   PLAN FOR NEXT SESSION:  work on home exercise program with pelvic floor contraction,   Earlie Counts, PT 11/11/21 5:04 PM PHYSICAL THERAPY DISCHARGE SUMMARY  Visits from Start of Care: 4  Current functional level related to goals / functional outcomes: See above. Patient cancelled last visit and did not reschedule.    Remaining deficits: See above.    Education / Equipment: HEP   Patient agrees to discharge. Patient goals were partially met. Patient is being discharged due to not returning since the last visit. Thank you for the referral. Earlie Counts, PT 12/10/21 8:07 AM

## 2021-11-22 ENCOUNTER — Ambulatory Visit: Payer: Medicaid Other | Admitting: Obstetrics and Gynecology

## 2021-11-22 ENCOUNTER — Encounter: Payer: Self-pay | Admitting: Family Medicine

## 2021-11-22 ENCOUNTER — Encounter: Payer: Self-pay | Admitting: General Practice

## 2021-11-22 ENCOUNTER — Ambulatory Visit (INDEPENDENT_AMBULATORY_CARE_PROVIDER_SITE_OTHER): Payer: Medicaid Other | Admitting: Family Medicine

## 2021-11-22 VITALS — BP 130/79 | HR 89 | Wt 262.0 lb

## 2021-11-22 DIAGNOSIS — O09522 Supervision of elderly multigravida, second trimester: Secondary | ICD-10-CM

## 2021-11-22 DIAGNOSIS — Z3492 Encounter for supervision of normal pregnancy, unspecified, second trimester: Secondary | ICD-10-CM | POA: Diagnosis not present

## 2021-11-22 DIAGNOSIS — O0992 Supervision of high risk pregnancy, unspecified, second trimester: Secondary | ICD-10-CM | POA: Diagnosis not present

## 2021-11-22 DIAGNOSIS — O10919 Unspecified pre-existing hypertension complicating pregnancy, unspecified trimester: Secondary | ICD-10-CM

## 2021-11-22 DIAGNOSIS — Z3A26 26 weeks gestation of pregnancy: Secondary | ICD-10-CM

## 2021-11-22 DIAGNOSIS — O10912 Unspecified pre-existing hypertension complicating pregnancy, second trimester: Secondary | ICD-10-CM

## 2021-11-22 DIAGNOSIS — O99212 Obesity complicating pregnancy, second trimester: Secondary | ICD-10-CM

## 2021-11-22 NOTE — Progress Notes (Signed)
   PRENATAL VISIT NOTE  Subjective:  Margaret Ryan is a 36 y.o. G3P2002 at [redacted]w[redacted]d being seen today for ongoing prenatal care.  She is currently monitored for the following issues for this high-risk pregnancy and has Chronic hypertension complicating pregnancy, antepartum; Supervision of high-risk pregnancy; Urinary retention; AMA (advanced maternal age) multigravida 35+; and Maternal morbid obesity, antepartum ([place]) on their problem list.  Patient reports no complaints.  Contractions: Not present. Vag. Bleeding: None.  Movement: Present. Denies leaking of fluid.   The following portions of the patient's history were reviewed and updated as appropriate: allergies, current medications, past family history, past medical history, past social history, past surgical history and problem list.   Objective:   Vitals:   11/22/21 0813  BP: 130/79  Pulse: 89  Weight: 262 lb (118.8 kg)    Fetal Status: Fetal Heart Rate (bpm): 134   Movement: Present     General:  Alert, oriented and cooperative. Patient is in no acute distress.  Skin: Skin is warm and dry. No rash noted.   Cardiovascular: Normal heart rate noted  Respiratory: Normal respiratory effort, no problems with respiration noted  Abdomen: Soft, gravid, appropriate for gestational age.  Pain/Pressure: Present     Pelvic: Cervical exam deferred        Extremities: Normal range of motion.  Edema: None  Mental Status: Normal mood and affect. Normal behavior. Normal judgment and thought content.   Assessment and Plan:  Pregnancy: G3P2002 at [redacted]w[redacted]d 1. [redacted] weeks gestation of pregnancy - CBC - Glucose Tolerance, 2 Hours w/1 Hour - HIV Antibody (routine testing w rflx) - RPR  2. Supervision of high risk pregnancy in second trimester FHT and FH normal Plans to use IUD or her husband will get a vasectomy.  3. Chronic hypertension complicating pregnancy, antepartum BP controlled off meds EFW 46% Plan antenatal testing at 32  weeks Delivery at 106  4. Multigravida of advanced maternal age in second trimester ASA 81mg   5. Maternal morbid obesity, antepartum ([place])   Preterm labor symptoms and general obstetric precautions including but not limited to vaginal bleeding, contractions, leaking of fluid and fetal movement were reviewed in detail with the patient. Please refer to After Visit Summary for other counseling recommendations.   No follow-ups on file.  Future Appointments  Date Time Provider [place]  11/27/2021  3:30 PM [facility]URSE [facility]  11/27/2021  3:45 PM [facility] US4 [facility]  11/27/2021  4:15 PM Monico Hoar, PT [facility] None  12/13/2021  8:55 AM Darliss Cheney, MD [facility] None  01/09/2022  8:35 AM Truett Mainland, DO [facility] None  01/24/2022  8:55 AM Constant, Vickii Chafe, MD [facility] None    Truett Mainland, DO

## 2021-11-23 LAB — GLUCOSE TOLERANCE, 2 HOURS W/ 1HR
Glucose, 1 hour: 110 mg/dL (ref 70–179)
Glucose, 2 hour: 99 mg/dL (ref 70–152)
Glucose, Fasting: 75 mg/dL (ref 70–91)

## 2021-11-23 LAB — CBC
Hematocrit: 38.3 % (ref 34.0–46.6)
Hemoglobin: 13.4 g/dL (ref 11.1–15.9)
MCH: 31.6 pg (ref 26.6–33.0)
MCHC: 35 g/dL (ref 31.5–35.7)
MCV: 90 fL (ref 79–97)
Platelets: 192 10*3/uL (ref 150–450)
RBC: 4.24 x10E6/uL (ref 3.77–5.28)
RDW: 13.4 % (ref 11.7–15.4)
WBC: 6.9 10*3/uL (ref 3.4–10.8)

## 2021-11-23 LAB — HIV ANTIBODY (ROUTINE TESTING W REFLEX): HIV Screen 4th Generation wRfx: NONREACTIVE

## 2021-11-23 LAB — RPR: RPR Ser Ql: NONREACTIVE

## 2021-11-27 ENCOUNTER — Ambulatory Visit: Payer: Medicaid Other | Admitting: *Deleted

## 2021-11-27 ENCOUNTER — Ambulatory Visit: Payer: Medicaid Other | Attending: Obstetrics

## 2021-11-27 ENCOUNTER — Ambulatory Visit: Payer: Medicaid Other | Admitting: Physical Therapy

## 2021-11-27 VITALS — BP 132/71 | HR 83

## 2021-11-27 DIAGNOSIS — O10012 Pre-existing essential hypertension complicating pregnancy, second trimester: Secondary | ICD-10-CM | POA: Diagnosis not present

## 2021-11-27 DIAGNOSIS — O99212 Obesity complicating pregnancy, second trimester: Secondary | ICD-10-CM | POA: Diagnosis not present

## 2021-11-27 DIAGNOSIS — O35EXX0 Maternal care for other (suspected) fetal abnormality and damage, fetal genitourinary anomalies, not applicable or unspecified: Secondary | ICD-10-CM | POA: Diagnosis not present

## 2021-11-27 DIAGNOSIS — O09522 Supervision of elderly multigravida, second trimester: Secondary | ICD-10-CM | POA: Diagnosis not present

## 2021-11-27 DIAGNOSIS — Z3A27 27 weeks gestation of pregnancy: Secondary | ICD-10-CM

## 2021-11-27 DIAGNOSIS — E669 Obesity, unspecified: Secondary | ICD-10-CM

## 2021-11-27 DIAGNOSIS — O99210 Obesity complicating pregnancy, unspecified trimester: Secondary | ICD-10-CM | POA: Insufficient documentation

## 2021-11-27 DIAGNOSIS — R638 Other symptoms and signs concerning food and fluid intake: Secondary | ICD-10-CM | POA: Insufficient documentation

## 2021-11-27 DIAGNOSIS — O0992 Supervision of high risk pregnancy, unspecified, second trimester: Secondary | ICD-10-CM

## 2021-11-28 ENCOUNTER — Other Ambulatory Visit: Payer: Self-pay | Admitting: *Deleted

## 2021-11-28 DIAGNOSIS — O10912 Unspecified pre-existing hypertension complicating pregnancy, second trimester: Secondary | ICD-10-CM

## 2021-12-04 DIAGNOSIS — Z419 Encounter for procedure for purposes other than remedying health state, unspecified: Secondary | ICD-10-CM | POA: Diagnosis not present

## 2021-12-13 ENCOUNTER — Ambulatory Visit (INDEPENDENT_AMBULATORY_CARE_PROVIDER_SITE_OTHER): Payer: Medicaid Other | Admitting: Obstetrics and Gynecology

## 2021-12-13 VITALS — BP 119/69 | HR 81 | Wt 265.0 lb

## 2021-12-13 DIAGNOSIS — O09522 Supervision of elderly multigravida, second trimester: Secondary | ICD-10-CM

## 2021-12-13 DIAGNOSIS — O09523 Supervision of elderly multigravida, third trimester: Secondary | ICD-10-CM

## 2021-12-13 DIAGNOSIS — O10913 Unspecified pre-existing hypertension complicating pregnancy, third trimester: Secondary | ICD-10-CM

## 2021-12-13 DIAGNOSIS — O0993 Supervision of high risk pregnancy, unspecified, third trimester: Secondary | ICD-10-CM

## 2021-12-13 DIAGNOSIS — Z3A29 29 weeks gestation of pregnancy: Secondary | ICD-10-CM

## 2021-12-13 DIAGNOSIS — O10919 Unspecified pre-existing hypertension complicating pregnancy, unspecified trimester: Secondary | ICD-10-CM

## 2021-12-13 NOTE — Progress Notes (Signed)
   PRENATAL VISIT NOTE  Subjective:  Margaret Ryan is a 36 y.o. G3P2002 at [redacted]w[redacted]d being seen today for ongoing prenatal care.  She is currently monitored for the following issues for this high-risk pregnancy and has Chronic hypertension complicating pregnancy, antepartum; Supervision of high-risk pregnancy; Urinary retention; AMA (advanced maternal age) multigravida 35+; and Maternal morbid obesity, antepartum ([facility]) on their problem list.  Patient reports no complaints.  Contractions: Not present. Vag. Bleeding: None.  Movement: Present. Denies leaking of fluid.   No issues with urinary retention or voiding. Continues taking her ASA and BP have been well controlled.   The following portions of the patient's history were reviewed and updated as appropriate: allergies, current medications, past family history, past medical history, past social history, past surgical history and problem list.   Objective:   Vitals:   12/13/21 0855  BP: 119/69  Pulse: 81  Weight: 265 lb (120.2 kg)    Fetal Status: Fetal Heart Rate (bpm): 129 Fundal Height: 29 cm Movement: Present     General:  Alert, oriented and cooperative. Patient is in no acute distress.  Skin: Skin is warm and dry. No rash noted.   Cardiovascular: Normal heart rate noted  Respiratory: Normal respiratory effort, no problems with respiration noted  Abdomen: Soft, gravid, appropriate for gestational age.  Pain/Pressure: Absent     Pelvic: Cervical exam deferred        Extremities: Normal range of motion.  Edema: None  Mental Status: Normal mood and affect. Normal behavior. Normal judgment and thought content.   Assessment and Plan:  Pregnancy: G3P2002 at [redacted]w[redacted]d 1. Supervision of high risk pregnancy in third trimester Doing well, FHR and FH appropriate Followed by MFM, next [place] 12/1, appropriate size on last [place]  2. [redacted] weeks gestation of pregnancy Defers tdap until later in pregnancy as she did with 2 prior   3. Chronic  hypertension complicating pregnancy, antepartum ASA 81mg , BP today wnl  4. Multigravida of advanced maternal age in second trimester ASA 81mg    Preterm labor symptoms and general obstetric precautions including but not limited to vaginal bleeding, contractions, leaking of fluid and fetal movement were reviewed in detail with the patient. Please refer to After Visit Summary for other counseling recommendations.   No follow-ups on file.  Future Appointments  Date Time Provider Department Center  01/03/2022  9:30 AM [facility] NURSE [facility]  01/03/2022  9:45 AM [facility] US7 [facility]  01/09/2022  8:35 AM [facility] DO [facility] None  01/24/2022  8:55 AM Constant, Levie Heritage, MD [facility] None    01/26/2022, MD

## 2022-01-01 ENCOUNTER — Telehealth: Payer: Self-pay

## 2022-01-01 NOTE — Telephone Encounter (Signed)
Patient called requesting to have documentation of her weight from her office visit on 12/13/21 faxed to the [facility] office in [facility]. Patient made aware that a ROI form will need to be signed before any medical information is faxed.Understanding was voiced. Sharrieff Spratlin l Malcomb Gangemi, CMA

## 2022-01-03 ENCOUNTER — Ambulatory Visit: Payer: Medicaid Other | Admitting: *Deleted

## 2022-01-03 ENCOUNTER — Ambulatory Visit: Payer: Medicaid Other | Attending: Obstetrics

## 2022-01-03 ENCOUNTER — Other Ambulatory Visit: Payer: Self-pay | Admitting: *Deleted

## 2022-01-03 VITALS — BP 147/83 | HR 85

## 2022-01-03 DIAGNOSIS — O99210 Obesity complicating pregnancy, unspecified trimester: Secondary | ICD-10-CM

## 2022-01-03 DIAGNOSIS — O283 Abnormal ultrasonic finding on antenatal screening of mother: Secondary | ICD-10-CM | POA: Diagnosis not present

## 2022-01-03 DIAGNOSIS — Z419 Encounter for procedure for purposes other than remedying health state, unspecified: Secondary | ICD-10-CM | POA: Diagnosis not present

## 2022-01-03 DIAGNOSIS — Z3A32 32 weeks gestation of pregnancy: Secondary | ICD-10-CM

## 2022-01-03 DIAGNOSIS — O10013 Pre-existing essential hypertension complicating pregnancy, third trimester: Secondary | ICD-10-CM | POA: Diagnosis not present

## 2022-01-03 DIAGNOSIS — O09523 Supervision of elderly multigravida, third trimester: Secondary | ICD-10-CM | POA: Diagnosis not present

## 2022-01-03 DIAGNOSIS — O0993 Supervision of high risk pregnancy, unspecified, third trimester: Secondary | ICD-10-CM

## 2022-01-03 DIAGNOSIS — E669 Obesity, unspecified: Secondary | ICD-10-CM

## 2022-01-03 DIAGNOSIS — O99212 Obesity complicating pregnancy, second trimester: Secondary | ICD-10-CM | POA: Diagnosis not present

## 2022-01-03 DIAGNOSIS — O99213 Obesity complicating pregnancy, third trimester: Secondary | ICD-10-CM

## 2022-01-03 DIAGNOSIS — Z362 Encounter for other antenatal screening follow-up: Secondary | ICD-10-CM

## 2022-01-03 DIAGNOSIS — O10913 Unspecified pre-existing hypertension complicating pregnancy, third trimester: Secondary | ICD-10-CM

## 2022-01-03 DIAGNOSIS — O10912 Unspecified pre-existing hypertension complicating pregnancy, second trimester: Secondary | ICD-10-CM | POA: Diagnosis not present

## 2022-01-04 ENCOUNTER — Encounter (HOSPITAL_COMMUNITY): Payer: Self-pay | Admitting: Specialist

## 2022-01-04 ENCOUNTER — Inpatient Hospital Stay (HOSPITAL_COMMUNITY)
Admission: AD | Admit: 2022-01-04 | Discharge: 2022-01-04 | Disposition: A | Discharge status: Home / Self Care | Payer: Medicaid Other | Attending: Specialist | Admitting: Specialist

## 2022-01-04 ENCOUNTER — Other Ambulatory Visit: Payer: Self-pay

## 2022-01-04 DIAGNOSIS — W109XXA Fall (on) (from) unspecified stairs and steps, initial encounter: Secondary | ICD-10-CM | POA: Diagnosis not present

## 2022-01-04 DIAGNOSIS — O09523 Supervision of elderly multigravida, third trimester: Secondary | ICD-10-CM | POA: Insufficient documentation

## 2022-01-04 DIAGNOSIS — Z3A32 32 weeks gestation of pregnancy: Secondary | ICD-10-CM | POA: Insufficient documentation

## 2022-01-04 DIAGNOSIS — I10 Essential (primary) hypertension: Secondary | ICD-10-CM | POA: Diagnosis not present

## 2022-01-04 DIAGNOSIS — M791 Myalgia, unspecified site: Secondary | ICD-10-CM | POA: Insufficient documentation

## 2022-01-04 DIAGNOSIS — O10913 Unspecified pre-existing hypertension complicating pregnancy, third trimester: Secondary | ICD-10-CM | POA: Insufficient documentation

## 2022-01-04 DIAGNOSIS — O0993 Supervision of high risk pregnancy, unspecified, third trimester: Secondary | ICD-10-CM

## 2022-01-04 DIAGNOSIS — O99213 Obesity complicating pregnancy, third trimester: Secondary | ICD-10-CM | POA: Diagnosis present

## 2022-01-04 DIAGNOSIS — O26893 Other specified pregnancy related conditions, third trimester: Secondary | ICD-10-CM | POA: Diagnosis not present

## 2022-01-04 DIAGNOSIS — O99210 Obesity complicating pregnancy, unspecified trimester: Secondary | ICD-10-CM | POA: Diagnosis not present

## 2022-01-04 DIAGNOSIS — W108XXA Fall (on) (from) other stairs and steps, initial encounter: Secondary | ICD-10-CM

## 2022-01-04 LAB — URINALYSIS, ROUTINE W REFLEX MICROSCOPIC
Bilirubin Urine: NEGATIVE
Glucose, UA: NEGATIVE mg/dL
Hgb urine dipstick: NEGATIVE
Ketones, ur: NEGATIVE mg/dL
Nitrite: NEGATIVE
Protein, ur: 30 mg/dL — AB
Specific Gravity, Urine: 1.024 (ref 1.005–1.030)
pH: 6 (ref 5.0–8.0)

## 2022-01-04 MED ORDER — CYCLOBENZAPRINE HCL 5 MG PO TABS
10.0000 mg | ORAL_TABLET | Freq: Once | ORAL | Status: AC
  Administered 2022-01-04: 10 mg via ORAL
  Filled 2022-01-04: qty 2

## 2022-01-04 MED ORDER — ACETAMINOPHEN 500 MG PO TABS
1000.0000 mg | ORAL_TABLET | Freq: Once | ORAL | Status: AC
  Administered 2022-01-04: 1000 mg via ORAL
  Filled 2022-01-04: qty 2

## 2022-01-04 NOTE — MAU Provider Note (Signed)
History     CSN: [id number]  Arrival date and time: 01/04/22 0827   Event Date/Time   First Provider Initiated Contact with Patient 01/04/22 [phone]      Chief Complaint  Patient presents with   Brigitte Pulse , a  36 y.o. [id number] at [redacted]w[redacted]d presents to MAU after she fell down the stairs in her home this morning about 7 am. Patient states she was helping her 38 year old down the stairs and slipped. Patient states she slid down the remaining stairs on her buttocks and back. Denies any abdominal trauma. Noted some soreness and tenderness since fall. Rating pain a 6/10. She denies vaginal bleeding, leaking of fluid since fall. Prior to arrival to MAU patient states she was not feeling "as much" fetal movement as normal, however since arrival to MAU patient states she is feeling frequent and vigorous fetal movement.        Fall Pertinent negatives include no abdominal pain, fever, headaches, nausea or vomiting.    OB History     Gravida  3   Para  2   Term  2   Preterm      AB      Living  2      SAB      IAB      Ectopic      Multiple  0   Live Births  2           Past Medical History:  Diagnosis Date   Anxiety    Complication of anesthesia    She thinks itching after general anesth w/tonsillectomy   Hypertension     Past Surgical History:  Procedure Laterality Date   TONSILLECTOMY      Family History  Problem Relation Age of Onset   Cancer Father    Hypertension Brother    Asthma Neg Hx    Diabetes Neg Hx    Heart disease Neg Hx    Stroke Neg Hx     Social History   Tobacco Use   Smoking status: Former   Smokeless tobacco: Never  Building services engineer Use: Never used  Substance Use Topics   Alcohol use: Not Currently    Comment: Occas   Drug use: Never    Allergies: No Known Allergies  Medications Prior to Admission  Medication Sig Dispense Refill Last Dose   aspirin EC 81 MG tablet Take 81 mg by mouth daily. Swallow  whole.   01/03/2022   cetirizine (ZYRTEC) 10 MG chewable tablet Chew 10 mg by mouth daily.   Past Week   cyclobenzaprine (FLEXERIL) 5 MG tablet Take 1 tablet (5 mg total) by mouth 3 (three) times daily as needed for muscle spasms. 60 tablet 5 Past Week   Docosahexaenoic Acid (DHA OMEGA 3 PO) Take by mouth.   01/03/2022   Methylcobalamin (METHYL B-12 PO) Take 1 tablet by mouth daily.   01/03/2022   Prenatal Vit-Fe Fumarate-FA (PRENATAL VITAMIN PO) Take 1 tablet by mouth daily.    01/03/2022    Review of Systems  Constitutional:  Negative for chills, fatigue and fever.  Eyes:  Negative for pain and visual disturbance.  Respiratory:  Negative for apnea, shortness of breath and wheezing.   Cardiovascular:  Negative for chest pain and palpitations.  Gastrointestinal:  Negative for abdominal pain, constipation, diarrhea, nausea and vomiting.  Genitourinary:  Negative for difficulty urinating, dysuria, pelvic pain, vaginal bleeding, vaginal discharge and vaginal pain.  Musculoskeletal:  Negative for back pain.  Neurological:  Negative for seizures, weakness and headaches.  Psychiatric/Behavioral:  Negative for suicidal ideas.    Physical Exam   Blood pressure 138/82, pulse 80, temperature 98 F (36.7 C), temperature source Oral, resp. rate 18, height 5\' 6"  (1.676 m), weight 119 kg, last menstrual period 05/11/2021, SpO2 96 %, unknown if currently breastfeeding.  Physical Exam Vitals and nursing note reviewed.  Constitutional:      General: She is not in acute distress.    Appearance: Normal appearance.  HENT:     Head: Normocephalic.  Pulmonary:     Effort: Pulmonary effort is normal.  Musculoskeletal:     Cervical back: Normal range of motion.  Skin:    General: Skin is warm and dry.  Neurological:     Mental Status: She is alert and oriented to person, place, and time.  Psychiatric:        Mood and Affect: Mood normal.    FHT: 125 bpm with moderate variability. 15x15 accels with  occasional variables with quick return to baseline (appropriate for gestational age.)  Toco: Quiet     MAU Course  Procedures Orders Placed This Encounter  Procedures   Urinalysis, Routine w reflex microscopic Urine, Clean Catch   Meds ordered this encounter  Medications   acetaminophen (TYLENOL) tablet 1,000 mg   cyclobenzaprine (FLEXERIL) tablet 10 mg    MDM UA reflexed to culture  PO Tylenol and Flexeril given for pain. Pain score decreased from 6 --> 4 FHT observed for 2 hours in MAU. (Up to 4 hours from incidence)  Remained Reactive and Reassuring Plan for discharge    Assessment and Plan   1. Fall (on) (from) other stairs and steps, initial encounter   2. Supervision of high risk pregnancy in third trimester   3. Maternal morbid obesity, antepartum (HCC)   4. Muscle pain   5. [redacted] weeks gestation of pregnancy   6. Chronic hypertension    - NST reactive and reassuring. Patient feeling active fetal movement in MAU.  - Reviewed worsening signs and return symptoms.  - Mildly Elevated BPs today in MAU. Patient is a CHTN no meds. Appointment scheduled for Tuesday. Message sent to Dr. Tuesday for BP on Tuesday in the office. Patient counseled on the possibility of starting HTN medication.  - Preterm labor precautions reviewed.  - Patient discharged home in stable condition and may return to MAU as needed.   Sunday, MSN CNM  01/04/2022, 10:28 AM

## 2022-01-04 NOTE — MAU Note (Signed)
Margaret Ryan is a 36 y.o. at [redacted]w[redacted]d here in MAU reporting: she fell down the stairs this morning at approx 0700.  Reports fell down stairs landing on buttocks and back, didn't strike abdomen.  Endorses +FM since fall.  Denies VB or LOF LMP: NA Onset of complaint: today Pain score: 6 back 7 3 leg Vitals:   01/04/22 0849  BP: (!) 154/87  Pulse: 95  Resp: 18  Temp: 98 F (36.7 C)  SpO2: 96%     FHT:142 bpm Lab orders placed from triage:   UA

## 2022-01-09 ENCOUNTER — Ambulatory Visit (INDEPENDENT_AMBULATORY_CARE_PROVIDER_SITE_OTHER): Payer: Medicaid Other | Admitting: Family Medicine

## 2022-01-09 VITALS — BP 136/82 | HR 92 | Wt 261.0 lb

## 2022-01-09 DIAGNOSIS — O10919 Unspecified pre-existing hypertension complicating pregnancy, unspecified trimester: Secondary | ICD-10-CM

## 2022-01-09 DIAGNOSIS — O09522 Supervision of elderly multigravida, second trimester: Secondary | ICD-10-CM

## 2022-01-09 DIAGNOSIS — Z3A33 33 weeks gestation of pregnancy: Secondary | ICD-10-CM

## 2022-01-09 DIAGNOSIS — O0993 Supervision of high risk pregnancy, unspecified, third trimester: Secondary | ICD-10-CM

## 2022-01-09 DIAGNOSIS — O10913 Unspecified pre-existing hypertension complicating pregnancy, third trimester: Secondary | ICD-10-CM

## 2022-01-09 DIAGNOSIS — O09523 Supervision of elderly multigravida, third trimester: Secondary | ICD-10-CM

## 2022-01-09 NOTE — Progress Notes (Signed)
   PRENATAL VISIT NOTE  Subjective:  Margaret Ryan is a 36 y.o. G3P2002 at [redacted]w[redacted]d being seen today for ongoing prenatal care.  She is currently monitored for the following issues for this high-risk pregnancy and has Chronic hypertension complicating pregnancy, antepartum; Supervision of high-risk pregnancy; Urinary retention; AMA (advanced maternal age) multigravida 35+; and Maternal morbid obesity, antepartum (HCC) on their problem list.  Patient reports  having a fall last week. Was seen in MAU. Has some bruising. Going to see Chiropractor today .  Contractions: Not present. Vag. Bleeding: None.  Movement: Present. Denies leaking of fluid.   The following portions of the patient's history were reviewed and updated as appropriate: allergies, current medications, past family history, past medical history, past social history, past surgical history and problem list.   Objective:   Vitals:   01/09/22 0822  BP: 136/82  Pulse: 92  Weight: 261 lb (118.4 kg)    Fetal Status: Fetal Heart Rate (bpm): 131 Fundal Height: 35 cm Movement: Present     General:  Alert, oriented and cooperative. Patient is in no acute distress.  Skin: Skin is warm and dry. No rash noted.   Cardiovascular: Normal heart rate noted  Respiratory: Normal respiratory effort, no problems with respiration noted  Abdomen: Soft, gravid, appropriate for gestational age.  Pain/Pressure: Present     Pelvic: Cervical exam deferred        Extremities: Normal range of motion.  Edema: None  Mental Status: Normal mood and affect. Normal behavior. Normal judgment and thought content.   Assessment and Plan:  Pregnancy: G3P2002 at [redacted]w[redacted]d 1. [redacted] weeks gestation of pregnancy  2. Supervision of high risk pregnancy in third trimester FHT and FH noraml  3. Chronic hypertension complicating pregnancy, antepartum EFW 80% BPP weekly  4. Multigravida of advanced maternal age in second trimester ASA 81mg   Preterm labor symptoms  and general obstetric precautions including but not limited to vaginal bleeding, contractions, leaking of fluid and fetal movement were reviewed in detail with the patient. Please refer to After Visit Summary for other counseling recommendations.   No follow-ups on file.  Future Appointments  Date Time Provider Department Center  01/17/2022  9:30 AM [facility] NURSE [facility]  01/17/2022  9:45 AM [facility] US7 [facility]  01/24/2022  8:55 AM Constant, Peggy, MD [facility] None  01/24/2022 12:30 PM [facility] NURSE [facility]  01/24/2022 12:45 PM [facility] US6 [facility]  01/31/2022  7:45 AM [facility] NURSE [facility]  01/31/2022  8:00 AM [facility] US1 [facility]  02/07/2022  8:30 AM [facility] NURSE [facility]  02/07/2022  8:45 AM [facility] US5 [facility]    04/08/2022, DO

## 2022-01-17 ENCOUNTER — Ambulatory Visit: Payer: Medicaid Other | Admitting: *Deleted

## 2022-01-17 ENCOUNTER — Ambulatory Visit: Payer: Medicaid Other | Attending: Obstetrics

## 2022-01-17 VITALS — BP 138/82 | HR 104

## 2022-01-17 DIAGNOSIS — Z362 Encounter for other antenatal screening follow-up: Secondary | ICD-10-CM | POA: Diagnosis not present

## 2022-01-17 DIAGNOSIS — O99213 Obesity complicating pregnancy, third trimester: Secondary | ICD-10-CM | POA: Insufficient documentation

## 2022-01-17 DIAGNOSIS — O99210 Obesity complicating pregnancy, unspecified trimester: Secondary | ICD-10-CM | POA: Diagnosis not present

## 2022-01-17 DIAGNOSIS — O283 Abnormal ultrasonic finding on antenatal screening of mother: Secondary | ICD-10-CM

## 2022-01-17 DIAGNOSIS — Z3A34 34 weeks gestation of pregnancy: Secondary | ICD-10-CM | POA: Diagnosis not present

## 2022-01-17 DIAGNOSIS — O10913 Unspecified pre-existing hypertension complicating pregnancy, third trimester: Secondary | ICD-10-CM | POA: Diagnosis not present

## 2022-01-17 DIAGNOSIS — O09523 Supervision of elderly multigravida, third trimester: Secondary | ICD-10-CM | POA: Diagnosis not present

## 2022-01-17 DIAGNOSIS — O0993 Supervision of high risk pregnancy, unspecified, third trimester: Secondary | ICD-10-CM

## 2022-01-17 DIAGNOSIS — O10013 Pre-existing essential hypertension complicating pregnancy, third trimester: Secondary | ICD-10-CM

## 2022-01-17 DIAGNOSIS — E669 Obesity, unspecified: Secondary | ICD-10-CM

## 2022-01-24 ENCOUNTER — Ambulatory Visit: Payer: Medicaid Other | Admitting: *Deleted

## 2022-01-24 ENCOUNTER — Ambulatory Visit: Payer: Medicaid Other | Attending: Obstetrics

## 2022-01-24 ENCOUNTER — Ambulatory Visit (INDEPENDENT_AMBULATORY_CARE_PROVIDER_SITE_OTHER): Payer: Medicaid Other | Admitting: Obstetrics and Gynecology

## 2022-01-24 ENCOUNTER — Encounter: Payer: Self-pay | Admitting: Obstetrics and Gynecology

## 2022-01-24 VITALS — BP 132/81 | HR 113

## 2022-01-24 VITALS — BP 133/83 | HR 97 | Wt 261.0 lb

## 2022-01-24 DIAGNOSIS — O0993 Supervision of high risk pregnancy, unspecified, third trimester: Secondary | ICD-10-CM

## 2022-01-24 DIAGNOSIS — O09523 Supervision of elderly multigravida, third trimester: Secondary | ICD-10-CM

## 2022-01-24 DIAGNOSIS — O10913 Unspecified pre-existing hypertension complicating pregnancy, third trimester: Secondary | ICD-10-CM | POA: Diagnosis not present

## 2022-01-24 DIAGNOSIS — E669 Obesity, unspecified: Secondary | ICD-10-CM

## 2022-01-24 DIAGNOSIS — O10013 Pre-existing essential hypertension complicating pregnancy, third trimester: Secondary | ICD-10-CM | POA: Diagnosis not present

## 2022-01-24 DIAGNOSIS — O10919 Unspecified pre-existing hypertension complicating pregnancy, unspecified trimester: Secondary | ICD-10-CM

## 2022-01-24 DIAGNOSIS — Z3A35 35 weeks gestation of pregnancy: Secondary | ICD-10-CM

## 2022-01-24 DIAGNOSIS — O283 Abnormal ultrasonic finding on antenatal screening of mother: Secondary | ICD-10-CM | POA: Diagnosis not present

## 2022-01-24 DIAGNOSIS — O99210 Obesity complicating pregnancy, unspecified trimester: Secondary | ICD-10-CM | POA: Insufficient documentation

## 2022-01-24 DIAGNOSIS — O99213 Obesity complicating pregnancy, third trimester: Secondary | ICD-10-CM

## 2022-01-24 LAB — POCT URINALYSIS DIPSTICK OB

## 2022-01-24 NOTE — Progress Notes (Signed)
   PRENATAL VISIT NOTE  Subjective:  Margaret Ryan is a 36 y.o. G3P2002 at [redacted]w[redacted]d being seen today for ongoing prenatal care.  She is currently monitored for the following issues for this high-risk pregnancy and has Chronic hypertension complicating pregnancy, antepartum; Supervision of high-risk pregnancy; Urinary retention; AMA (advanced maternal age) multigravida 35+; and Maternal morbid obesity, antepartum (HCC) on their problem list.  Patient reports no complaints.  Contractions: Not present. Vag. Bleeding: None.  Movement: Present. Denies leaking of fluid.   The following portions of the patient's history were reviewed and updated as appropriate: allergies, current medications, past family history, past medical history, past social history, past surgical history and problem list.   Objective:   Vitals:   01/24/22 0846  BP: 133/83  Pulse: 97  Weight: 261 lb (118.4 kg)    Fetal Status: Fetal Heart Rate (bpm): 140 Fundal Height: 36 cm Movement: Present     General:  Alert, oriented and cooperative. Patient is in no acute distress.  Skin: Skin is warm and dry. No rash noted.   Cardiovascular: Normal heart rate noted  Respiratory: Normal respiratory effort, no problems with respiration noted  Abdomen: Soft, gravid, appropriate for gestational age.  Pain/Pressure: Present (more pressure by end of day.)     Pelvic: Cervical exam deferred        Extremities: Normal range of motion.  Edema: None  Mental Status: Normal mood and affect. Normal behavior. Normal judgment and thought content.   Assessment and Plan:  Pregnancy: G3P2002 at [redacted]w[redacted]d 1. Supervision of high risk pregnancy in third trimester Patient is doing well Cultures next visit  2. Maternal morbid obesity, antepartum (HCC) Continue ASA  3. Chronic hypertension complicating pregnancy, antepartum Normotensive in the office without complaints Patient reports elevated BP at home and at work as listed by Charity fundraiser. She denies  any symptoms such as HA, visual changes, RUQ/epigastric pain Continue ASA Will order labs Continue antenatal testing per MFM schedule- ultrasound today  4. Multigravida of advanced maternal age in third trimester Normal NIPS Patient plans Mirena IUD  Preterm labor symptoms and general obstetric precautions including but not limited to vaginal bleeding, contractions, leaking of fluid and fetal movement were reviewed in detail with the patient. Please refer to After Visit Summary for other counseling recommendations.   Return in about 1 week (around 01/31/2022) for in person, ROB, High risk.  Future Appointments  Date Time Provider Department Center  01/24/2022 12:30 PM [facility] NURSE [facility]  01/24/2022 12:45 PM [facility] US6 [facility]  01/31/2022  7:45 AM [facility] NURSE [facility]  01/31/2022  8:00 AM [facility] US1 [facility]  01/31/2022 10:15 AM Lorriane Shire, MD [facility] None  02/07/2022  8:30 AM [facility] NURSE [facility]  02/07/2022  8:45 AM [facility] US5 [facility]  02/12/2022  8:15 AM Levie Heritage, DO [facility] None  02/19/2022  8:15 AM Stinson, Rhona Raider, DO [facility] None    Catalina Antigua, MD

## 2022-01-24 NOTE — Progress Notes (Signed)
Patient states that she has gotten some high readings at work:  130/99 153/100 143/97 138/94 These are self reported readings. Armandina Stammer RN

## 2022-01-25 LAB — COMPREHENSIVE METABOLIC PANEL
ALT: 29 IU/L (ref 0–32)
AST: 31 IU/L (ref 0–40)
Albumin/Globulin Ratio: 1.5 (ref 1.2–2.2)
Albumin: 3.5 g/dL — ABNORMAL LOW (ref 3.9–4.9)
Alkaline Phosphatase: 107 IU/L (ref 44–121)
BUN/Creatinine Ratio: 12 (ref 9–23)
BUN: 8 mg/dL (ref 6–20)
Bilirubin Total: 0.3 mg/dL (ref 0.0–1.2)
CO2: 19 mmol/L — ABNORMAL LOW (ref 20–29)
Calcium: 8.8 mg/dL (ref 8.7–10.2)
Chloride: 104 mmol/L (ref 96–106)
Creatinine, Ser: 0.69 mg/dL (ref 0.57–1.00)
Globulin, Total: 2.3 g/dL (ref 1.5–4.5)
Glucose: 92 mg/dL (ref 70–99)
Potassium: 4.3 mmol/L (ref 3.5–5.2)
Sodium: 137 mmol/L (ref 134–144)
Total Protein: 5.8 g/dL — ABNORMAL LOW (ref 6.0–8.5)
eGFR: 114 mL/min/{1.73_m2} (ref >59)

## 2022-01-25 LAB — PROTEIN / CREATININE RATIO, URINE
Creatinine, Urine: 197.5 mg/dL
Protein, Ur: 35.3 mg/dL
Protein/Creat Ratio: 179 mg/g creat (ref 0–200)

## 2022-01-25 LAB — CBC
Hematocrit: 37.8 % (ref 34.0–46.6)
Hemoglobin: 12.9 g/dL (ref 11.1–15.9)
MCH: 30.7 pg (ref 26.6–33.0)
MCHC: 34.1 g/dL (ref 31.5–35.7)
MCV: 90 fL (ref 79–97)
Platelets: 195 10*3/uL (ref 150–450)
RBC: 4.2 x10E6/uL (ref 3.77–5.28)
RDW: 12.4 % (ref 11.7–15.4)
WBC: 7.8 10*3/uL (ref 3.4–10.8)

## 2022-01-31 ENCOUNTER — Ambulatory Visit (INDEPENDENT_AMBULATORY_CARE_PROVIDER_SITE_OTHER): Payer: Medicaid Other | Admitting: Obstetrics and Gynecology

## 2022-01-31 ENCOUNTER — Other Ambulatory Visit
Admission: RE | Admit: 2022-01-31 | Discharge: 2022-01-31 | Disposition: A | Discharge status: Home / Self Care | Payer: Medicaid Other | Source: Ambulatory Visit | Attending: Obstetrics | Admitting: Obstetrics

## 2022-01-31 ENCOUNTER — Ambulatory Visit: Payer: Medicaid Other | Admitting: *Deleted

## 2022-01-31 ENCOUNTER — Encounter: Payer: Self-pay | Admitting: *Deleted

## 2022-01-31 ENCOUNTER — Ambulatory Visit (HOSPITAL_BASED_OUTPATIENT_CLINIC_OR_DEPARTMENT_OTHER): Payer: Medicaid Other

## 2022-01-31 VITALS — BP 121/83 | HR 108 | Wt 261.0 lb

## 2022-01-31 DIAGNOSIS — O0993 Supervision of high risk pregnancy, unspecified, third trimester: Secondary | ICD-10-CM | POA: Diagnosis not present

## 2022-01-31 DIAGNOSIS — Z3A36 36 weeks gestation of pregnancy: Secondary | ICD-10-CM

## 2022-01-31 DIAGNOSIS — O09523 Supervision of elderly multigravida, third trimester: Secondary | ICD-10-CM

## 2022-01-31 DIAGNOSIS — O99210 Obesity complicating pregnancy, unspecified trimester: Secondary | ICD-10-CM | POA: Diagnosis not present

## 2022-01-31 DIAGNOSIS — O10013 Pre-existing essential hypertension complicating pregnancy, third trimester: Secondary | ICD-10-CM

## 2022-01-31 DIAGNOSIS — O10913 Unspecified pre-existing hypertension complicating pregnancy, third trimester: Secondary | ICD-10-CM | POA: Diagnosis not present

## 2022-01-31 DIAGNOSIS — O99213 Obesity complicating pregnancy, third trimester: Secondary | ICD-10-CM | POA: Diagnosis not present

## 2022-01-31 DIAGNOSIS — E669 Obesity, unspecified: Secondary | ICD-10-CM | POA: Diagnosis not present

## 2022-01-31 DIAGNOSIS — O10919 Unspecified pre-existing hypertension complicating pregnancy, unspecified trimester: Secondary | ICD-10-CM

## 2022-01-31 NOTE — Patient Instructions (Signed)
We will schedule induction for 38th week

## 2022-01-31 NOTE — Progress Notes (Signed)
   PRENATAL VISIT NOTE  Subjective:  Margaret Ryan is a 36 y.o. G3P2002 at [redacted]w[redacted]d being seen today for ongoing prenatal care.  She is currently monitored for the following issues for this high-risk pregnancy and has Chronic hypertension complicating pregnancy, antepartum; Supervision of high-risk pregnancy; Urinary retention; AMA (advanced maternal age) multigravida 35+; and Maternal morbid obesity, antepartum (HCC) on their problem list.  Patient reports no complaints.  Contractions: Irritability. Vag. Bleeding: None.  Movement: Present. Denies leaking of fluid.   States she can feel when BP is elevated, slight SOB/general discomfort without HA, chest pain, etc will check BP and it's elevated and re-check in a few minutes and it's normal  Feels this baby is bigger than her last  The following portions of the patient's history were reviewed and updated as appropriate: allergies, current medications, past family history, past medical history, past social history, past surgical history and problem list.   Objective:   Vitals:   01/31/22 1008  BP: 121/83  Pulse: (!) 108  Weight: 261 lb (118.4 kg)    Fetal Status: Fetal Heart Rate (bpm): 135   Movement: Present     General:  Alert, oriented and cooperative. Patient is in no acute distress.  Skin: Skin is warm and dry. No rash noted.   Cardiovascular: Normal heart rate noted  Respiratory: Normal respiratory effort, no problems with respiration noted  Abdomen: Soft, gravid, appropriate for gestational age.  Pain/Pressure: Present     Pelvic: Cervical exam deferred        Extremities: Normal range of motion.  Edema: Trace  Mental Status: Normal mood and affect. Normal behavior. Normal judgment and thought content.   Assessment and Plan:  Pregnancy: G3P2002 at [redacted]w[redacted]d 1. [redacted] weeks gestation of pregnancy Feels baby may be a little bigger Otherwise, doing ok  GBS and GC/CT collected today   2. Supervision of high risk pregnancy in  third trimester Normal FHR, 84th%ile this AM  3. Chronic hypertension complicating pregnancy, antepartum No meds BP vary - ok in visit, elevated at [place] appt this AMA.  Will plan for IOL 1/18 ([redacted]w[redacted]d) - declined earlier date due to other obligations  Preterm labor symptoms and general obstetric precautions including but not limited to vaginal bleeding, contractions, leaking of fluid and fetal movement were reviewed in detail with the patient. Please refer to After Visit Summary for other counseling recommendations.   No follow-ups on file.  Future Appointments  Date Time Provider Department Center  01/31/2022 10:15 AM Lorriane Shire, MD [facility] None  02/07/2022  8:30 AM [facility] NURSE [facility]  02/07/2022  8:45 AM [facility] US5 [facility]  02/12/2022  8:15 AM Levie Heritage, DO [facility] None  02/19/2022  8:15 AM Adrian Blackwater, Rhona Raider, DO [facility] None    Lorriane Shire, MD

## 2022-02-03 DIAGNOSIS — Z419 Encounter for procedure for purposes other than remedying health state, unspecified: Secondary | ICD-10-CM | POA: Diagnosis not present

## 2022-02-03 NOTE — L&D Delivery Note (Signed)
OB/GYN Faculty Practice Delivery Note  Margaret Ryan is a 37 y.o. [id number] s/p VD at [redacted]w[redacted]d. She was admitted for IOL BPP 6/10.   ROM: 0h 23m with clear fluid GBS Status:  Negative/-- (12/29 1142) Maximum Maternal Temperature: 98.57F  Labor Progress: Initial SVE: 1/thick/ballotable. She then progressed to complete.   Delivery Date/Time: 02/07/22 2224 Delivery: Called to room and patient was complete and pushing. Head delivered OA. Nuchal cord presentx1, body cord x1. Shoulder and body delivered in usual fashion. Infant with spontaneous cry, placed on mother's abdomen, dried and stimulated. Cord clamped x 2 after 1-minute delay, and cut by FOB. Cord blood drawn. Placenta delivered spontaneously with gentle cord traction. Fundus firm with massage and Pitocin. Labia, perineum, vagina, and cervix inspected with 1st degree laceration, repaired in usual fashion.  Baby Weight: pending  Placenta: 3 vessel, intact. Sent to L&D Complications: None Lacerations: 1st degree laceration, repaired in usual fashion.  EBL: 86 mL Analgesia: Epidural   Infant:  APGAR (1 MIN): 9   APGAR (5 MINS): Belva, DO OB Family Medicine Fellow, [facility] for [facility], [facility] 02/07/2022, 10:41 PM

## 2022-02-04 LAB — GC/CHLAMYDIA PROBE AMP (~~LOC~~) NOT AT ARMC
Chlamydia: NEGATIVE
Comment: NEGATIVE
Comment: NORMAL
Neisseria Gonorrhea: NEGATIVE

## 2022-02-04 LAB — CULTURE, BETA STREP (GROUP B ONLY): Strep Gp B Culture: NEGATIVE

## 2022-02-06 DIAGNOSIS — Z3483 Encounter for supervision of other normal pregnancy, third trimester: Secondary | ICD-10-CM | POA: Diagnosis not present

## 2022-02-06 DIAGNOSIS — Z3482 Encounter for supervision of other normal pregnancy, second trimester: Secondary | ICD-10-CM | POA: Diagnosis not present

## 2022-02-07 ENCOUNTER — Inpatient Hospital Stay (HOSPITAL_COMMUNITY)
Admission: RE | Admit: 2022-02-07 | Discharge: 2022-02-09 | DRG: 806 | Disposition: A | Discharge status: Home / Self Care | Payer: Medicaid Other | Attending: Obstetrics and Gynecology | Admitting: Obstetrics and Gynecology

## 2022-02-07 ENCOUNTER — Inpatient Hospital Stay (HOSPITAL_COMMUNITY): Payer: Medicaid Other | Admitting: Anesthesiology

## 2022-02-07 ENCOUNTER — Ambulatory Visit (HOSPITAL_BASED_OUTPATIENT_CLINIC_OR_DEPARTMENT_OTHER): Payer: Medicaid Other

## 2022-02-07 ENCOUNTER — Ambulatory Visit: Payer: Medicaid Other | Admitting: *Deleted

## 2022-02-07 ENCOUNTER — Encounter (HOSPITAL_COMMUNITY): Payer: Self-pay | Admitting: Obstetrics and Gynecology

## 2022-02-07 ENCOUNTER — Other Ambulatory Visit: Payer: Self-pay

## 2022-02-07 ENCOUNTER — Ambulatory Visit (HOSPITAL_BASED_OUTPATIENT_CLINIC_OR_DEPARTMENT_OTHER): Payer: Medicaid Other | Admitting: Maternal & Fetal Medicine

## 2022-02-07 ENCOUNTER — Other Ambulatory Visit: Payer: Self-pay | Admitting: Obstetrics

## 2022-02-07 VITALS — BP 127/80 | HR 108

## 2022-02-07 DIAGNOSIS — Z87891 Personal history of nicotine dependence: Secondary | ICD-10-CM

## 2022-02-07 DIAGNOSIS — O09523 Supervision of elderly multigravida, third trimester: Secondary | ICD-10-CM

## 2022-02-07 DIAGNOSIS — Z7982 Long term (current) use of aspirin: Secondary | ICD-10-CM | POA: Diagnosis not present

## 2022-02-07 DIAGNOSIS — O10913 Unspecified pre-existing hypertension complicating pregnancy, third trimester: Secondary | ICD-10-CM

## 2022-02-07 DIAGNOSIS — O1092 Unspecified pre-existing hypertension complicating childbirth: Secondary | ICD-10-CM | POA: Diagnosis not present

## 2022-02-07 DIAGNOSIS — O358XX0 Maternal care for other (suspected) fetal abnormality and damage, not applicable or unspecified: Secondary | ICD-10-CM | POA: Diagnosis not present

## 2022-02-07 DIAGNOSIS — O99210 Obesity complicating pregnancy, unspecified trimester: Secondary | ICD-10-CM | POA: Insufficient documentation

## 2022-02-07 DIAGNOSIS — O0990 Supervision of high risk pregnancy, unspecified, unspecified trimester: Secondary | ICD-10-CM

## 2022-02-07 DIAGNOSIS — O10013 Pre-existing essential hypertension complicating pregnancy, third trimester: Secondary | ICD-10-CM

## 2022-02-07 DIAGNOSIS — O0993 Supervision of high risk pregnancy, unspecified, third trimester: Secondary | ICD-10-CM | POA: Insufficient documentation

## 2022-02-07 DIAGNOSIS — O368390 Maternal care for abnormalities of the fetal heart rate or rhythm, unspecified trimester, not applicable or unspecified: Secondary | ICD-10-CM | POA: Insufficient documentation

## 2022-02-07 DIAGNOSIS — O09529 Supervision of elderly multigravida, unspecified trimester: Secondary | ICD-10-CM

## 2022-02-07 DIAGNOSIS — O99214 Obesity complicating childbirth: Secondary | ICD-10-CM | POA: Diagnosis present

## 2022-02-07 DIAGNOSIS — O99891 Other specified diseases and conditions complicating pregnancy: Secondary | ICD-10-CM

## 2022-02-07 DIAGNOSIS — O283 Abnormal ultrasonic finding on antenatal screening of mother: Secondary | ICD-10-CM | POA: Diagnosis not present

## 2022-02-07 DIAGNOSIS — E669 Obesity, unspecified: Secondary | ICD-10-CM

## 2022-02-07 DIAGNOSIS — O99213 Obesity complicating pregnancy, third trimester: Secondary | ICD-10-CM | POA: Diagnosis not present

## 2022-02-07 DIAGNOSIS — Z3A37 37 weeks gestation of pregnancy: Secondary | ICD-10-CM | POA: Diagnosis not present

## 2022-02-07 DIAGNOSIS — O1002 Pre-existing essential hypertension complicating childbirth: Secondary | ICD-10-CM | POA: Diagnosis not present

## 2022-02-07 DIAGNOSIS — R339 Retention of urine, unspecified: Secondary | ICD-10-CM | POA: Diagnosis present

## 2022-02-07 DIAGNOSIS — O368330 Maternal care for abnormalities of the fetal heart rate or rhythm, third trimester, not applicable or unspecified: Secondary | ICD-10-CM | POA: Diagnosis not present

## 2022-02-07 DIAGNOSIS — O10919 Unspecified pre-existing hypertension complicating pregnancy, unspecified trimester: Secondary | ICD-10-CM

## 2022-02-07 DIAGNOSIS — O99892 Other specified diseases and conditions complicating childbirth: Secondary | ICD-10-CM | POA: Diagnosis present

## 2022-02-07 LAB — CBC
HCT: 37.7 % (ref 36.0–46.0)
HCT: 38.4 % (ref 36.0–46.0)
Hemoglobin: 12.7 g/dL (ref 12.0–15.0)
Hemoglobin: 13.2 g/dL (ref 12.0–15.0)
MCH: 29.5 pg (ref 26.0–34.0)
MCH: 29.7 pg (ref 26.0–34.0)
MCHC: 33.7 g/dL (ref 30.0–36.0)
MCHC: 34.4 g/dL (ref 30.0–36.0)
MCV: 87.7 fL (ref 80.0–100.0)
MCV: 86.5 fL (ref 80.0–100.0)
Platelets: 204 10*3/uL (ref 150–400)
Platelets: 198 10*3/uL (ref 150–400)
RBC: 4.3 MIL/uL (ref 3.87–5.11)
RBC: 4.44 MIL/uL (ref 3.87–5.11)
RDW: 12.4 % (ref 11.5–15.5)
RDW: 12.4 % (ref 11.5–15.5)
WBC: 7.8 10*3/uL (ref 4.0–10.5)
WBC: 8.8 10*3/uL (ref 4.0–10.5)
nRBC: 0 % (ref 0.0–0.2)
nRBC: 0 % (ref 0.0–0.2)

## 2022-02-07 LAB — RPR: RPR Ser Ql: NONREACTIVE

## 2022-02-07 LAB — COMPREHENSIVE METABOLIC PANEL
ALT: 18 U/L (ref 0–44)
AST: 33 U/L (ref 15–41)
Albumin: 2.8 g/dL — ABNORMAL LOW (ref 3.5–5.0)
Alkaline Phosphatase: 91 U/L (ref 38–126)
Anion gap: 11 (ref 5–15)
BUN: 6 mg/dL (ref 6–20)
CO2: 19 mmol/L — ABNORMAL LOW (ref 22–32)
Calcium: 8.6 mg/dL — ABNORMAL LOW (ref 8.9–10.3)
Chloride: 107 mmol/L (ref 98–111)
Creatinine, Ser: 0.75 mg/dL (ref 0.44–1.00)
GFR, Estimated: >60 mL/min (ref >60)
Glucose, Bld: 115 mg/dL — ABNORMAL HIGH (ref 70–99)
Potassium: 3.7 mmol/L (ref 3.5–5.1)
Sodium: 137 mmol/L (ref 135–145)
Total Bilirubin: 0.5 mg/dL (ref 0.3–1.2)
Total Protein: 5.9 g/dL — ABNORMAL LOW (ref 6.5–8.1)

## 2022-02-07 LAB — PROTEIN / CREATININE RATIO, URINE
Creatinine, Urine: 190 mg/dL
Protein Creatinine Ratio: 0.08 mg/mg{Cre} (ref 0.00–0.15)
Total Protein, Urine: 15 mg/dL

## 2022-02-07 LAB — TYPE AND SCREEN
ABO/RH(D): O POS
Antibody Screen: NEGATIVE

## 2022-02-07 MED ORDER — ACETAMINOPHEN 325 MG PO TABS: 650.0000 mg | ORAL_TABLET | ORAL | Status: DC | PRN

## 2022-02-07 MED ORDER — MISOPROSTOL 50MCG HALF TABLET
50.0000 ug | ORAL_TABLET | Freq: Once | ORAL | Status: AC
  Administered 2022-02-07: 50 ug via ORAL

## 2022-02-07 MED ORDER — EPHEDRINE 5 MG/ML INJ: 10.0000 mg | INTRAVENOUS | Status: DC | PRN

## 2022-02-07 MED ORDER — LACTATED RINGERS IV SOLN: INTRAVENOUS | Status: DC

## 2022-02-07 MED ORDER — OXYTOCIN BOLUS FROM INFUSION
333.0000 mL | Freq: Once | INTRAVENOUS | Status: AC
  Administered 2022-02-07: 333 mL via INTRAVENOUS

## 2022-02-07 MED ORDER — ONDANSETRON HCL 4 MG/2ML IJ SOLN
4.0000 mg | Freq: Four times a day (QID) | INTRAMUSCULAR | Status: DC | PRN
  Administered 2022-02-07: 4 mg via INTRAVENOUS
  Filled 2022-02-07: qty 2

## 2022-02-07 MED ORDER — CYCLOBENZAPRINE HCL 5 MG PO TABS
5.0000 mg | ORAL_TABLET | Freq: Every evening | ORAL | Status: DC | PRN
  Administered 2022-02-07: 5 mg via ORAL
  Filled 2022-02-07: qty 1

## 2022-02-07 MED ORDER — MISOPROSTOL 25 MCG QUARTER TABLET
25.0000 ug | ORAL_TABLET | Freq: Once | ORAL | Status: AC
  Administered 2022-02-07: 25 ug via VAGINAL

## 2022-02-07 MED ORDER — SOD CITRATE-CITRIC ACID 500-334 MG/5ML PO SOLN: 30.0000 mL | ORAL | Status: DC | PRN

## 2022-02-07 MED ORDER — LACTATED RINGERS IV SOLN
500.0000 mL | Freq: Once | INTRAVENOUS | Status: AC
  Administered 2022-02-07: 500 mL via INTRAVENOUS

## 2022-02-07 MED ORDER — LIDOCAINE HCL (PF) 1 % IJ SOLN
INTRAMUSCULAR | Status: DC | PRN
  Administered 2022-02-07: 10 mL via EPIDURAL
  Administered 2022-02-07: 2 mL via EPIDURAL

## 2022-02-07 MED ORDER — FENTANYL-BUPIVACAINE-NACL 0.5-0.125-0.9 MG/250ML-% EP SOLN
EPIDURAL | Status: DC | PRN
  Administered 2022-02-07: 12 mL/h via EPIDURAL

## 2022-02-07 MED ORDER — PHENYLEPHRINE 80 MCG/ML (10ML) SYRINGE FOR IV PUSH (FOR BLOOD PRESSURE SUPPORT): 80.0000 ug | PREFILLED_SYRINGE | INTRAVENOUS | Status: DC | PRN

## 2022-02-07 MED ORDER — LIDOCAINE HCL (PF) 1 % IJ SOLN: 30.0000 mL | INTRAMUSCULAR | Status: DC | PRN

## 2022-02-07 MED ORDER — DIPHENHYDRAMINE HCL 50 MG/ML IJ SOLN: 12.5000 mg | INTRAMUSCULAR | Status: DC | PRN

## 2022-02-07 MED ORDER — TERBUTALINE SULFATE 1 MG/ML IJ SOLN: 0.2500 mg | Freq: Once | INTRAMUSCULAR | Status: DC | PRN

## 2022-02-07 MED ORDER — CYCLOBENZAPRINE HCL 5 MG PO TABS: 5.0000 mg | ORAL_TABLET | Freq: Three times a day (TID) | ORAL | Status: DC | PRN

## 2022-02-07 MED ORDER — OXYCODONE-ACETAMINOPHEN 5-325 MG PO TABS: 1.0000 | ORAL_TABLET | ORAL | Status: DC | PRN

## 2022-02-07 MED ORDER — FENTANYL-BUPIVACAINE-NACL 0.5-0.125-0.9 MG/250ML-% EP SOLN
12.0000 mL/h | EPIDURAL | Status: DC | PRN
  Filled 2022-02-07: qty 250

## 2022-02-07 MED ORDER — PHENYLEPHRINE 80 MCG/ML (10ML) SYRINGE FOR IV PUSH (FOR BLOOD PRESSURE SUPPORT)
80.0000 ug | PREFILLED_SYRINGE | INTRAVENOUS | Status: DC | PRN
  Filled 2022-02-07: qty 10

## 2022-02-07 MED ORDER — OXYCODONE-ACETAMINOPHEN 5-325 MG PO TABS: 2.0000 | ORAL_TABLET | ORAL | Status: DC | PRN

## 2022-02-07 MED ORDER — OXYTOCIN-SODIUM CHLORIDE 30-0.9 UT/500ML-% IV SOLN
2.5000 [IU]/h | INTRAVENOUS | Status: DC
  Administered 2022-02-07: 2.5 [IU]/h via INTRAVENOUS
  Filled 2022-02-07: qty 500

## 2022-02-07 NOTE — Anesthesia Procedure Notes (Signed)
Epidural Patient location during procedure: OB Start time: 02/07/2022 8:51 PM End time: 02/07/2022 8:59 PM  Staffing Anesthesiologist: Pervis Hocking, DO Performed: anesthesiologist   Preanesthetic Checklist Completed: patient identified, IV checked, risks and benefits discussed, monitors and equipment checked, pre-op evaluation and timeout performed  Epidural Patient position: sitting Prep: DuraPrep and site prepped and draped Patient monitoring: continuous pulse ox, blood pressure, heart rate and cardiac monitor Approach: midline Location: L3-L4 Injection technique: LOR air  Needle:  Needle type: Tuohy  Needle gauge: 17 G Needle length: 9 cm Needle insertion depth: 7 cm Catheter type: closed end flexible Catheter size: 19 Gauge Catheter at skin depth: 12 cm Test dose: negative  Assessment Sensory level: T8 Events: blood not aspirated, no cerebrospinal fluid, injection not painful, no injection resistance, no paresthesia and negative IV test  Additional Notes Patient identified. Risks/Benefits/Options discussed with patient including but not limited to bleeding, infection, nerve damage, paralysis, failed block, incomplete pain control, headache, blood pressure changes, nausea, vomiting, reactions to medication both or allergic, itching and postpartum back pain. Confirmed with bedside nurse the patient's most recent platelet count. Confirmed with patient that they are not currently taking any anticoagulation, have any bleeding history or any family history of bleeding disorders. Patient expressed understanding and wished to proceed. All questions were answered. Sterile technique was used throughout the entire procedure. Please see nursing notes for vital signs. Test dose was given through epidural catheter and negative prior to continuing to dose epidural or start infusion. Warning signs of high block given to the patient including shortness of breath, tingling/numbness in  hands, complete motor block, or any concerning symptoms with instructions to call for help. Patient was given instructions on fall risk and not to get out of bed. All questions and concerns addressed with instructions to call with any issues or inadequate analgesia.  Reason for block:procedure for pain

## 2022-02-07 NOTE — Discharge Summary (Signed)
Postpartum Discharge Summary  Date of Service updated***     Patient Name: Margaret Ryan DOB: 1985-09-19 MRN: [id number]  Date of admission: 02/07/2022 Delivery date:02/07/2022  Delivering provider: [name]  Date of discharge: 02/07/2022  Admitting diagnosis: Indication for care in labor or delivery [O75.9] Intrauterine pregnancy: [redacted]w[redacted]d     Secondary diagnosis:  Principal Problem:   Indication for care in labor or delivery  Additional problems: ***    Discharge diagnosis: Term Pregnancy Delivered                                              Post partum procedures:{Postpartum procedures:23558} Augmentation: AROM, Cytotec, and IP Foley Complications: None  Hospital course: Induction of Labor With Vaginal Delivery   37 y.o. yo G3P2002 at [redacted]w[redacted]d was admitted to the hospital 02/07/2022 for induction of labor.  Indication for induction:  BPP/6/10 .  Patient had an labor course complicated by none Membrane Rupture Time/Date: 9:56 PM ,02/07/2022   Delivery Method:Vaginal, Spontaneous  Episiotomy: None  Lacerations:  1st degree;Perineal  Details of delivery can be found in separate delivery note.  Patient had a postpartum course complicated by ***. Patient is discharged home 02/07/22.  Newborn Data: Birth date:02/07/2022  Birth time:10:24 PM  Gender:Female  Living status:Living  Apgars:9 ,9  Weight:   Magnesium Sulfate received: {Mag received:30440022} BMZ received: No Rhophylac:N/A MMR:N/A T-DaP: declined Flu: No Transfusion:{Transfusion received:30440034}  Physical exam  Vitals:   02/07/22 2136 02/07/22 2140 02/07/22 2141 02/07/22 2201  BP: (!) 122/94  128/80 123/69  Pulse: (!) 144  90 87  Resp: 18  18 18   Temp:      TempSrc:      SpO2:  98%    Weight:      Height:       General: {Exam; general:21111117} Lochia: {Desc; appropriate/inappropriate:30686::"appropriate"} Uterine Fundus: {Desc; firm/soft:30687} Incision: {Exam; incision:21111123} DVT  Evaluation: {Exam; dvt:2111122} Labs: Lab Results  Component Value Date   WBC 8.8 02/07/2022   HGB 13.2 02/07/2022   HCT 38.4 02/07/2022   MCV 86.5 02/07/2022   PLT 198 02/07/2022      Latest Ref Rng & Units 02/07/2022   12:38 PM  CMP  Glucose 70 - 99 mg/dL 115   BUN 6 - 20 mg/dL 6   Creatinine 0.44 - 1.00 mg/dL 0.75   Sodium 135 - 145 mmol/L 137   Potassium 3.5 - 5.1 mmol/L 3.7   Chloride 98 - 111 mmol/L 107   CO2 22 - 32 mmol/L 19   Calcium 8.9 - 10.3 mg/dL 8.6   Total Protein 6.5 - 8.1 g/dL 5.9   Total Bilirubin 0.3 - 1.2 mg/dL 0.5   Alkaline Phos 38 - 126 U/L 91   AST 15 - 41 U/L 33   ALT 0 - 44 U/L 18    Edinburgh Score:    10/28/2019   12:56 AM  Edinburgh Postnatal Depression Scale Screening Tool  I have been able to laugh and see the funny side of things. 0  I have looked forward with enjoyment to things. 0  I have blamed myself unnecessarily when things went wrong. 1  I have been anxious or worried for no good reason. 0  I have felt scared or panicky for no good reason. 0  Things have been getting on top of me. 0  I have been so unhappy  that I have had difficulty sleeping. 0  I have felt sad or miserable. 0  I have been so unhappy that I have been crying. 0  The thought of harming myself has occurred to me. 0  Edinburgh Postnatal Depression Scale Total 1     After visit meds:  Allergies as of 02/07/2022   No Known Allergies   Med Rec must be completed prior to using this [facility]***        Discharge home in stable condition Infant Feeding: {Baby feeding:23562} Infant Disposition:{CHL IP OB HOME WITH MWUXLK:44010} Discharge instruction: per After Visit Summary and Postpartum booklet. Activity: Advance as tolerated. Pelvic rest for 6 weeks.  Diet: {OB UVOZ:36644034} Future Appointments: Future Appointments  Date Time Provider [place]  02/12/2022  8:15 AM Truett Mainland, DO [facility] None  02/19/2022  8:15 AM Nehemiah Settle Tanna Savoy, DO [facility]  None   Follow up Visit: Message sent to HP on 02/07/22  Please schedule this patient for a In person postpartum visit in 6 weeks with the following provider: Any provider. Additional Postpartum F/U:BP check 1 week  High risk pregnancy complicated by: HTN Delivery mode:  Vaginal, Spontaneous  Anticipated Birth Control:  IUD op   02/07/2022 [name], DO

## 2022-02-07 NOTE — H&P (Addendum)
OBSTETRIC ADMISSION HISTORY AND PHYSICAL  Margaret Ryan is a 37 y.o. female G3P2002 with IUP at [redacted]w[redacted]d by early [place] presenting for IOL 2/2 non-reassuring fetal heart tones (BPP 6/10, -2 fetal movement and tone).   She reports +FMs, no LOF, no contractions, no vaginal bleeding, no blurry vision, headaches or peripheral edema, and RUQ pain.   She plans on breast feeding and bottle feeding, difficulties latching previously children. Open to lactation referral. She request Mirena IUD for birth control. She received her prenatal care at [facility] office.  Still having some urinary retention, feels not fully emptying her bladder. Feels it is related to baby position. Trialed the pessary in first trimester but did not like it. Felt the leaking started after 2nd pregnancy, tried pelvic floor PT but too much of a time commitment. Endorses some "prolapse" that was identified by urogyn.  Dating: by early fetal US --->  Estimated Date of Delivery: 02/26/22  Sono: @[redacted]w[redacted]d , CWD, normal anatomy, cephalic presentation, [id number], 84% EFW   Prenatal History/Complications:  Chronic HTN - not on meds Maternal morbid obesity (BMI >40) Advanced maternal age (37 yo) Urinary retention  Past Medical History: Past Medical History:  Diagnosis Date   Anxiety    Complication of anesthesia    She thinks itching after general anesth w/tonsillectomy   Hypertension     Past Surgical History: Past Surgical History:  Procedure Laterality Date   TONSILLECTOMY      Obstetrical History: OB History     Gravida  3   Para  2   Term  2   Preterm      AB      Living  2      SAB      IAB      Ectopic      Multiple  0   Live Births  2           Social History Social History   Socioeconomic History   Marital status: Married    Spouse name: Not on file   Number of children: Not on file   Years of education: Not on file   Highest education level: Not on file  Occupational History    Not on file  Tobacco Use   Smoking status: Former   Smokeless tobacco: Never  Vaping Use   Vaping Use: Never used  Substance and Sexual Activity   Alcohol use: Not Currently    Comment: Occas   Drug use: Never   Sexual activity: Yes  Other Topics Concern   Not on file  Social History Narrative   Not on file   Social Determinants of Health   Financial Resource Strain: Not on file  Food Insecurity: Not on file  Transportation Needs: Not on file  Physical Activity: Not on file  Stress: Not on file  Social Connections: Not on file    Family History: Family History  Problem Relation Age of Onset   Cancer Father    Hypertension Brother    Asthma Neg Hx    Diabetes Neg Hx    Heart disease Neg Hx    Stroke Neg Hx     Allergies: No Known Allergies  Medications Prior to Admission  Medication Sig Dispense Refill Last Dose   aspirin EC 81 MG tablet Take 81 mg by mouth daily. Swallow whole.      cetirizine (ZYRTEC) 10 MG chewable tablet Chew 10 mg by mouth daily.      cyclobenzaprine (  FLEXERIL) 5 MG tablet Take 1 tablet (5 mg total) by mouth 3 (three) times daily as needed for muscle spasms. 60 tablet 5    Docosahexaenoic Acid (DHA OMEGA 3 PO) Take by mouth.      Methylcobalamin (METHYL B-12 PO) Take 1 tablet by mouth daily.      Prenatal Vit-Fe Fumarate-FA (PRENATAL VITAMIN PO) Take 1 tablet by mouth daily.       Review of Systems   All systems reviewed and negative except as stated in HPI  Blood pressure 127/80, pulse (!) 134, temperature (!) 97.5 F (36.4 C), temperature source Oral, height 5\' 6"  (1.676 m), weight 119.2 kg, last menstrual period 05/11/2021, unknown if currently breastfeeding. General appearance: alert, cooperative, appears stated age, and no distress Lungs: clear to auscultation bilaterally Heart: regular rate and rhythm Abdomen: soft, non-tender; bowel sounds normal Extremities: Homans sign is negative, no sign of DVT DTR's 2+ Presentation:  cephalic Fetal monitoringBaseline: 140s bpm and Variability: Good {> 6 bpm), Accelerations: Reactive, and Decelerations: Absent Uterine activity: none Dilation: 1 Effacement (%): Thick Station: Ballotable Exam by:: Pauline Pegues, CNM   EFW: 01/31/2022, 7# 2 oz AFI today: wnl  Prenatal labs: ABO, Rh: --/--/O POS (01/05 1238) Antibody: NEG (01/05 1238) Rubella: 5.12 (06/29 1018) RPR: Non Reactive (10/20 0840)  HBsAg: Negative (06/29 1018)  HIV: Non Reactive (10/20 0840)  GBS: Negative/-- (12/29 1142)  2 hr Glucola: negative (11/22/2021) Genetic screening: normal (08/01/2021) Anatomy 08/03/2021: normal (10/25/2021)  Prenatal Transfer Tool  Maternal Diabetes: No Genetic Screening: Normal Maternal Ultrasounds/Referrals: Fetal renal pyelectasis - resolved Fetal Ultrasounds or other Referrals:  Referred to Materal Fetal Medicine  Maternal Substance Abuse:  No Significant Maternal Medications:  Meds include: Other: ASA 81mg  Significant Maternal Lab Results: None, GBS negative  Results for orders placed or performed during the hospital encounter of 02/07/22 (from the past 24 hour(s))  CBC   Collection Time: 02/07/22 12:37 PM  Result Value Ref Range   WBC 7.8 4.0 - 10.5 K/uL   RBC 4.30 3.87 - 5.11 MIL/uL   Hemoglobin 12.7 12.0 - 15.0 g/dL   HCT 04/08/22 04/08/22 - 54.2 %   MCV 87.7 80.0 - 100.0 fL   MCH 29.5 26.0 - 34.0 pg   MCHC 33.7 30.0 - 36.0 g/dL   RDW 70.6 23.7 - 62.8 %   Platelets 204 150 - 400 K/uL   nRBC 0.0 0.0 - 0.2 %  Type and screen [facility]   Collection Time: 02/07/22 12:38 PM  Result Value Ref Range   ABO/RH(D) O POS    Antibody Screen NEG    Sample Expiration      02/10/2022,2359 Performed at [facility], [address], [place], [address] [place]     Patient Active Problem List   Diagnosis Date Noted   Non-reassuring fetal heart tones complicating pregnancy, antepartum 02/07/2022   Indication for care in labor or delivery 02/07/2022   AMA  (advanced maternal age) multigravida 35+ 08/30/2021   Maternal morbid obesity, antepartum (HCC) 08/30/2021   Urinary retention 08/05/2021   Supervision of high-risk pregnancy 08/01/2021   Chronic hypertension complicating pregnancy, antepartum 10/27/2019    Assessment/Plan:  Margaret Ryan is a 37 y.o. G3P2002 at [redacted]w[redacted]d here for IOL 2/2 BPP 6/10, -2 fetal movement and tone  #Labor: IOL, Cyto 50/25 #cHTN: BP's normal, labs pending. Patient asymptomatic. Continue to monitor. #Pain: Epidural upon request, Flexeril prn arm pain #FWB: Adequate HR and variability, cat 1 #ID: GBS neg #  MOF: Breast and bottle feeding #MOC: Mirena IUD   [name], MD  02/07/2022, 1:41 PM   Attestation of CNM Supervision of Resident: Evaluation and management procedures were performed by the [facility] Medicine Resident under my supervision. I was immediately available for direct supervision, assistance and direction throughout this encounter.  I also confirm that I have verified the information documented in the resident's note, and that I have also personally reperformed the pertinent components of the physical exam and all of the medical decision making activities.  I have also made any necessary editorial changes.  Patient is here for IOL in the setting of BPP 6/10. Cytotec 50/33mcg given. Consider foley balloon at next check.    Renee Harder, CNM 02/07/2022 2:11 PM

## 2022-02-07 NOTE — Anesthesia Preprocedure Evaluation (Signed)
Anesthesia Evaluation  Patient identified by MRN, date of birth, ID band Patient awake    Reviewed: Allergy & Precautions, Patient's Chart, lab work & pertinent test results  Airway Mallampati: II  TM Distance: >3 FB Neck ROM: Full    Dental no notable dental hx.    Pulmonary neg pulmonary ROS, former smoker   Pulmonary exam normal breath sounds clear to auscultation       Cardiovascular hypertension (cHTN no meds), Normal cardiovascular exam Rhythm:Regular Rate:Normal     Neuro/Psych  PSYCHIATRIC DISORDERS Anxiety     negative neurological ROS     GI/Hepatic negative GI ROS, Neg liver ROS,,,  Endo/Other    Morbid obesityBMI 42  Renal/GU negative Renal ROS  negative genitourinary   Musculoskeletal negative musculoskeletal ROS (+)    Abdominal  (+) + obese  Peds negative pediatric ROS (+)  Hematology negative hematology ROS (+) Hb 13.2, plt 198   Anesthesia Other Findings   Reproductive/Obstetrics (+) Pregnancy                             Anesthesia Physical Anesthesia Plan  ASA: 3  Anesthesia Plan: Epidural   Post-op Pain Management:    Induction:   PONV Risk Score and Plan: 2  Airway Management Planned: Natural Airway  Additional Equipment: None  Intra-op Plan:   Post-operative Plan:   Informed Consent: I have reviewed the patients History and Physical, chart, labs and discussed the procedure including the risks, benefits and alternatives for the proposed anesthesia with the patient or authorized representative who has indicated his/her understanding and acceptance.       Plan Discussed with:   Anesthesia Plan Comments:        Anesthesia Quick Evaluation

## 2022-02-07 NOTE — Progress Notes (Addendum)
Margaret Ryan is a 37 y.o. G3P2002 at [redacted]w[redacted]d by ultrasound admitted for induction of labor due to Non-reactive NST.  Subjective:  No acute concern. Contractions are increasing and patient is trying to move around to help labor progress. Trying to hold off on pain management interventions.   Objective: BP 138/87   Pulse 93   Temp (!) 97.5 F (36.4 C) (Oral)   Ht 5\' 6"  (1.676 m)   Wt 119.2 kg   LMP 05/11/2021   BMI 42.42 kg/m  No intake/output data recorded. No intake/output data recorded.  FHT:  FHR: 130 bpm, variability: moderate,  accelerations:  Present,  decelerations:  Absent UC:   irregular, about every 5 minutes SVE:   Dilation: 1.5 Effacement (%): 50 Station: Ballotable Exam by:: [employer], CNM  Labs: Lab Results  Component Value Date   WBC 7.8 02/07/2022   HGB 12.7 02/07/2022   HCT 37.7 02/07/2022   MCV 87.7 02/07/2022   PLT 204 02/07/2022    Assessment / Plan: Margaret Ryan is a 37 y.o. female G81P2002 with IUP at [redacted]w[redacted]d by early [place] presenting for IOL 2/2 non-reassuring fetal heart tones (BPP 6/10, -2 fetal movement and tone).   Labor: Cervical softening and mild progression of labor with Cytotec 50/83mcg. Inserted foley balloon with ~50cc saline inflation. Plan to reassess when balloon falls out. Preeclampsia:  BPs stable. no signs or symptoms of toxicity Fetal Wellbeing:  Category I Pain Control:  Labor support without medications, plan for epidural when requested I/D:  n/a Anticipated MOD:  NSVD  Margaret Maryland, MD 02/07/2022, 6:53 PM   Attestation of CNM Supervision of Resident: Evaluation and management procedures were performed by the [facility] Medicine Resident under my supervision. I was immediately available for direct supervision, assistance and direction throughout this encounter.  I also confirm that I have verified the information documented in the resident's note, and that I have also personally reperformed the pertinent components of the  physical exam and all of the medical decision making activities.  I have also made any necessary editorial changes.  Foley balloon placed by CNM. Hold cytotec at this time given patient is contracting Q2 mins. May give additional dose or start low dose pitocin if contractions space out.   cHTN: CBC and CMP wnl. UPCR pending. BP's stable. Patient remains asymptomatic.   Margaret Ryan, CNM 02/07/2022 6:54 PM

## 2022-02-07 NOTE — Progress Notes (Signed)
MFM Consult Note Patient Name: Margaret Ryan  Patient MRN:   [id number]  Referring provider: [place] Reason for Consult: BPP 6/10   HPI: Margaret Ryan is a 37 y.o. G3P2002 at [redacted]w[redacted]d here for ultrasound and consultation.   The patient was receiving a BPP today in the office for chronic hypertension, AMA and elevated BMI.  BPP was 6 out of 10, -2 for fetal tone and movement.  The NST was reactive.  I discussed delivery after 37 weeks in the setting of a BPP that 6 out of 10.  The patient verbalized understanding and will head directly to the hospital.  I have notified the on-call provider as well.  Review of Systems: A review of systems was performed and was negative except per HPI   Vitals and Physical Exam See intake sheet for vitals Sitting comfortably on the sonogram table Nonlabored breathing Normal rate and rhythm Abdomen is nontender  Genetic testing: Normal  Ultrasound findings Single intrauterine pregnancy. Fetal cardiac activity: Observed. Presentation: Cephalic. Interval fetal anatomy appears normal. Amniotic fluid volume: Within normal limits. AFI: 11.1 cm.  MVP: 5. cm. Placenta: Anterior. BPP: 6/10   Assessment - CHTN (no meds) - BMI 37 - AMA 37y - BPP 6/10  Plan -I discussed the concern for fetal hypoxia and stillbirth in the setting of a BPP of 6 out of 10 and indication for delivery.  Patient is able to verbalize understanding and is agreeable to induction.  She was sent to the hospital for delivery.     I spent 30 minutes reviewing the patients chart, including labs and images as well as counseling the patient about her medical conditions.  Valeda Malm  MFM, [place]   02/07/2022  10:48 AM

## 2022-02-07 NOTE — Procedures (Signed)
Daniele Dillow [place] [birth year] [redacted]w[redacted]d  Fetus A Non-Stress Test Interpretation for 02/07/22  Indication: Unsatisfactory BPP  Fetal Heart Rate A Mode: External Baseline Rate (A): 130 bpm Variability: Moderate Accelerations: 15 x 15 Decelerations: None Multiple birth?: No  Uterine Activity Mode: Toco Contraction Frequency (min): none Resting Tone Palpated: Relaxed  Interpretation (Fetal Testing) Nonstress Test Interpretation: Reactive Overall Impression: Reassuring for gestational age Comments: tracing reviewed by Dr. Epimenio Sarin

## 2022-02-08 LAB — CBC
HCT: 34.1 % — ABNORMAL LOW (ref 36.0–46.0)
Hemoglobin: 11.9 g/dL — ABNORMAL LOW (ref 12.0–15.0)
MCH: 30.4 pg (ref 26.0–34.0)
MCHC: 34.9 g/dL (ref 30.0–36.0)
MCV: 87.2 fL (ref 80.0–100.0)
Platelets: 172 10*3/uL (ref 150–400)
RBC: 3.91 MIL/uL (ref 3.87–5.11)
RDW: 12.5 % (ref 11.5–15.5)
WBC: 10.8 10*3/uL — ABNORMAL HIGH (ref 4.0–10.5)
nRBC: 0 % (ref 0.0–0.2)

## 2022-02-08 MED ORDER — ONDANSETRON HCL 4 MG/2ML IJ SOLN: 4.0000 mg | INTRAMUSCULAR | Status: DC | PRN

## 2022-02-08 MED ORDER — DIBUCAINE (PERIANAL) 1 % EX OINT: 1.0000 | TOPICAL_OINTMENT | CUTANEOUS | Status: DC | PRN

## 2022-02-08 MED ORDER — WITCH HAZEL-GLYCERIN EX PADS: 1.0000 | MEDICATED_PAD | CUTANEOUS | Status: DC | PRN

## 2022-02-08 MED ORDER — BENZOCAINE-MENTHOL 20-0.5 % EX AERO: 1.0000 | INHALATION_SPRAY | CUTANEOUS | Status: DC | PRN

## 2022-02-08 MED ORDER — SIMETHICONE 80 MG PO CHEW: 80.0000 mg | CHEWABLE_TABLET | ORAL | Status: DC | PRN

## 2022-02-08 MED ORDER — ACETAMINOPHEN 325 MG PO TABS: 650.0000 mg | ORAL_TABLET | ORAL | Status: DC | PRN

## 2022-02-08 MED ORDER — COCONUT OIL OIL: 1.0000 | TOPICAL_OIL | Status: DC | PRN

## 2022-02-08 MED ORDER — DIPHENHYDRAMINE HCL 25 MG PO CAPS: 25.0000 mg | ORAL_CAPSULE | Freq: Four times a day (QID) | ORAL | Status: DC | PRN

## 2022-02-08 MED ORDER — ONDANSETRON HCL 4 MG PO TABS: 4.0000 mg | ORAL_TABLET | ORAL | Status: DC | PRN

## 2022-02-08 MED ORDER — IBUPROFEN 600 MG PO TABS
600.0000 mg | ORAL_TABLET | Freq: Four times a day (QID) | ORAL | Status: DC
  Administered 2022-02-08 – 2022-02-09 (×5): 600 mg via ORAL
  Filled 2022-02-08 (×6): qty 1

## 2022-02-08 MED ORDER — SODIUM CHLORIDE 0.9% FLUSH: 3.0000 mL | INTRAVENOUS | Status: DC | PRN

## 2022-02-08 MED ORDER — FUROSEMIDE 20 MG PO TABS
20.0000 mg | ORAL_TABLET | Freq: Two times a day (BID) | ORAL | Status: DC
  Administered 2022-02-08 – 2022-02-09 (×3): 20 mg via ORAL
  Filled 2022-02-08 (×3): qty 1

## 2022-02-08 MED ORDER — PRENATAL MULTIVITAMIN CH
1.0000 | ORAL_TABLET | Freq: Every day | ORAL | Status: DC
  Administered 2022-02-08: 1 via ORAL
  Filled 2022-02-08: qty 1

## 2022-02-08 MED ORDER — ZOLPIDEM TARTRATE 5 MG PO TABS: 5.0000 mg | ORAL_TABLET | Freq: Every evening | ORAL | Status: DC | PRN

## 2022-02-08 MED ORDER — SENNOSIDES-DOCUSATE SODIUM 8.6-50 MG PO TABS
2.0000 | ORAL_TABLET | ORAL | Status: DC
  Administered 2022-02-08: 2 via ORAL
  Filled 2022-02-08: qty 2

## 2022-02-08 MED ORDER — SODIUM CHLORIDE 0.9% FLUSH: 3.0000 mL | Freq: Two times a day (BID) | INTRAVENOUS | Status: DC

## 2022-02-08 MED ORDER — SODIUM CHLORIDE 0.9 % IV SOLN: 250.0000 mL | INTRAVENOUS | Status: DC | PRN

## 2022-02-08 NOTE — Lactation Note (Signed)
This note was copied from a baby's chart. Lactation Consultation Note  Patient Name: Margaret Ryan [id number] Date: 02/08/2022 Age: 39 hours old  Reason for consult: Initial assessment;Early term 37-38.6wks;Infant weight loss (1 % weight loss, per mom baby recently fed formula from a bottle. Per mom ususally ends up pumping and bottle feeding like she did with her 1st 2 . Baby has been to the Br, and per mom due to hand swelling its hard to feel safe with breast feeding.) DEBP was already set up in the room and per mom prefers the hand pump over the DEBP.  LC encouraged mom to call if needed.   Maternal Data Does the patient have breastfeeding experience prior to this delivery?: Yes How long did the patient breastfeed?: per mom pumped 6 months to 1 year with her 1st 2.  Feeding Mother's Current Feeding Choice: Breast Milk and Formula Nipple Type: Slow - flow   Lactation Tools Discussed/Used  DEBP   Interventions Interventions: Breast feeding basics reviewed;Education;DEBP;Hand pump;LC Services brochure  Discharge Pump: Hands Free;DEBP;Personal;Manual  Consult Status Consult Status: PRN Date: 02/09/22 Follow-up type: In-patient    [place] 02/08/2022, 12:24 PM

## 2022-02-08 NOTE — Progress Notes (Addendum)
CSW was consulted due to history of anxiety. CSW met with MOB at bedside to complete assessment. When CSW entered room, MOB was observed sitting in hospital bed. FOB was present, holding infant. CSW introduced self and requested to speak with MOB alone. MOB provided verbal consent to speak in front of FOB. CSW explained reason for consult. MOB declined consult, sharing that she has a history of circumstantial anxiety but did not experience any mental health concerns during pregnancy or since delivery. MOB shares that everything is going well and denied a history of postpartum depression/anxiety. MOB has an Edinburgh Postnatal Depression Scale score of 3 during current admission.  MOB was receptive to educational handout with information about the baby blues period vs. perinatal mood disorders.  CSW recommends self-evaluation during the postpartum time period using the New Mom Checklist from Postpartum Progress and encouraged MOB to contact a medical professional if symptoms are noted at any time.    CSW identifies no further need for intervention and no barriers to discharge at this time.  Signed,  Taiwan Talcott K. Viveca Beckstrom, MSW, LCSWA, LCASA 02/08/2022 6:01 PM 

## 2022-02-08 NOTE — Anesthesia Postprocedure Evaluation (Signed)
Anesthesia Post Note  Patient: Margaret Ryan  Procedure(s) Performed: AN AD HOC LABOR EPIDURAL     Anesthesia Type: Epidural Anesthetic complications: no   No notable events documented.  Last Vitals:  Vitals:   02/08/22 0952 02/08/22 1425  BP: 128/75 116/77  Pulse: 72 75  Resp: 18   Temp: 36.6 C 36.6 C  SpO2: 97% 98%    Last Pain:  Vitals:   02/08/22 1425  TempSrc: Oral  PainSc:    Pain Goal:                   [name]

## 2022-02-08 NOTE — Anesthesia Postprocedure Evaluation (Signed)
Anesthesia Post Note  Patient: Margaret Ryan  Procedure(s) Performed: AN AD [place]     Patient location during evaluation: Mother Baby Anesthesia Type: Epidural Level of consciousness: awake and alert Pain management: pain level controlled Vital Signs Assessment: post-procedure vital signs reviewed and stable Respiratory status: spontaneous breathing, nonlabored ventilation and respiratory function stable Cardiovascular status: stable Postop Assessment: no headache, no backache and epidural receding Anesthetic complications: no   No notable events documented.  Last Vitals:  Vitals:   02/08/22 0952 02/08/22 1425  BP: 128/75 116/77  Pulse: 72 75  Resp: 18   Temp: 36.6 C 36.6 C  SpO2: 97% 98%    Last Pain:  Vitals:   02/08/22 1425  TempSrc: Oral  PainSc:    Pain Goal:                   [name]

## 2022-02-08 NOTE — Progress Notes (Addendum)
POSTPARTUM PROGRESS NOTE  Post Partum Day #0  Subjective:  Margaret Ryan is a 37 y.o. G3P3003 s/p VD at [redacted]w[redacted]d.  No acute events overnight.  Pt denies problems with ambulating, voiding or po intake.  She denies nausea or vomiting.  Pain is well controlled.  She has had flatus. She has not had bowel movement.  Lochia Moderate - similar to period  Objective: Blood pressure 121/70, pulse 85, temperature 97.9 F (36.6 C), temperature source Oral, resp. rate 18, height 5\' 6"  (1.676 m), weight 119.2 kg, last menstrual period 05/11/2021, SpO2 98 %, unknown if currently breastfeeding.  Physical Exam:  General: alert, cooperative and no distress Chest: no respiratory distress Heart:regular rate, distal pulses intact Abdomen: soft, nontender,  Uterine Fundus: firm, appropriately tender DVT Evaluation: No calf swelling or tenderness Extremities: no peripheral edema Skin: warm, dry  Recent Labs    02/07/22 2011 02/08/22 0531  HGB 13.2 11.9*  HCT 38.4 34.1*    Assessment/Plan: Margaret Ryan is a 37 y.o. G3P3003 s/p VD at [redacted]w[redacted]d   PPD#0 - Doing well, BP well controlled Contraception: IUD Feeding: Breast feeding Dispo: Home on 1/7   LOS: 1 day   [name], MD 02/08/2022, 8:44 AM

## 2022-02-09 MED ORDER — FUROSEMIDE 20 MG PO TABS: 20.0000 mg | ORAL_TABLET | Freq: Two times a day (BID) | ORAL | 0 refills | Status: DC

## 2022-02-09 MED ORDER — BENZOCAINE-MENTHOL 20-0.5 % EX AERO: 1.0000 | INHALATION_SPRAY | CUTANEOUS | 0 refills | Status: DC | PRN

## 2022-02-09 NOTE — Lactation Note (Signed)
This note was copied from a baby's chart. Lactation Consultation Note  Patient Name: Margaret Ryan [id number] Date: 02/09/2022 Reason for consult: Follow-up assessment;Early term 37-38.6wks;Infant weight loss (5 % weight loss. LC discussed with parents by 34 hours old baby should take at least 30 ml per mom feeding and increasing with hunger each day. LC reviewed BF D/C teaching and mom aware of [place] resources. Pump and bottlefeed.) Age:39 hours  Maternal Data    Feeding Mother's Current Feeding Choice: Breast Milk and Formula Nipple Type: Slow - flow   Lactation Tools Discussed/Used Tools: Pump (already set up yesterday)  Interventions Interventions: Breast feeding basics reviewed;Skin to skin;Education  Discharge Discharge Education: Engorgement and breast care Pump: Hands Free;DEBP;Personal  Consult Status Consult Status: Complete Date: 02/09/22    Jerlyn Ly Hollye Pritt 02/09/2022, 11:28 AM

## 2022-02-12 ENCOUNTER — Encounter: Payer: Self-pay | Admitting: Family Medicine

## 2022-02-12 ENCOUNTER — Ambulatory Visit (INDEPENDENT_AMBULATORY_CARE_PROVIDER_SITE_OTHER): Payer: Medicaid Other | Admitting: Family Medicine

## 2022-02-12 VITALS — BP 147/94 | HR 79 | Wt 258.0 lb

## 2022-02-12 DIAGNOSIS — R109 Unspecified abdominal pain: Secondary | ICD-10-CM | POA: Diagnosis not present

## 2022-02-12 DIAGNOSIS — Z1152 Encounter for screening for COVID-19: Secondary | ICD-10-CM | POA: Diagnosis not present

## 2022-02-12 DIAGNOSIS — O1093 Unspecified pre-existing hypertension complicating the puerperium: Secondary | ICD-10-CM

## 2022-02-12 DIAGNOSIS — N852 Hypertrophy of uterus: Secondary | ICD-10-CM | POA: Diagnosis not present

## 2022-02-12 DIAGNOSIS — Z5329 Procedure and treatment not carried out because of patient's decision for other reasons: Secondary | ICD-10-CM | POA: Diagnosis not present

## 2022-02-12 DIAGNOSIS — I10 Essential (primary) hypertension: Secondary | ICD-10-CM | POA: Diagnosis not present

## 2022-02-12 DIAGNOSIS — O10919 Unspecified pre-existing hypertension complicating pregnancy, unspecified trimester: Secondary | ICD-10-CM

## 2022-02-12 DIAGNOSIS — R935 Abnormal findings on diagnostic imaging of other abdominal regions, including retroperitoneum: Secondary | ICD-10-CM | POA: Diagnosis not present

## 2022-02-12 DIAGNOSIS — F53 Postpartum depression: Secondary | ICD-10-CM

## 2022-02-12 IMAGING — US US MFM OB FOLLOW-UP
1 series · 13 of 28 positions shown · non-contrast
Comparison: none

[Series 1: us mfm ob follow-up · 85 acquisitions, 13 frames shown]
[im 4/85]
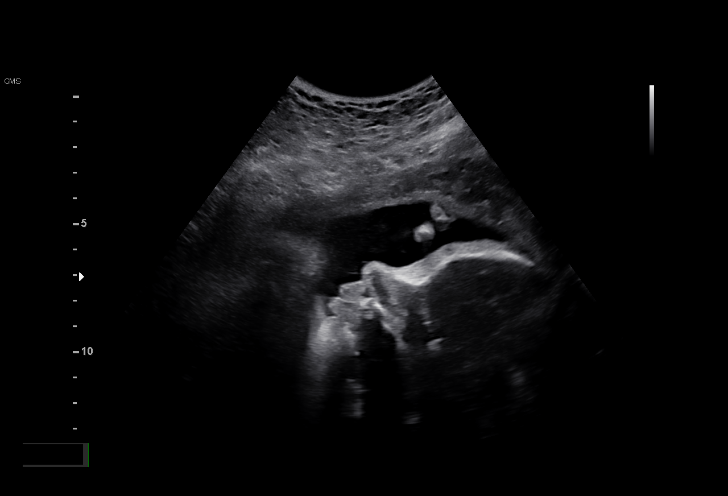
[im 10/85]
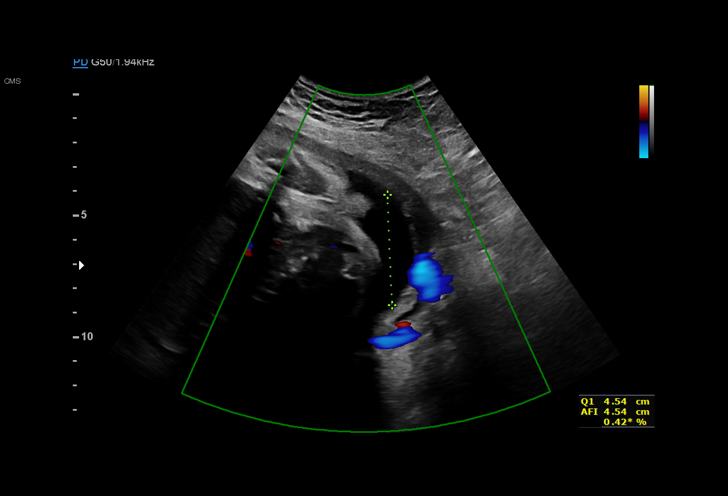
[im 16/85]
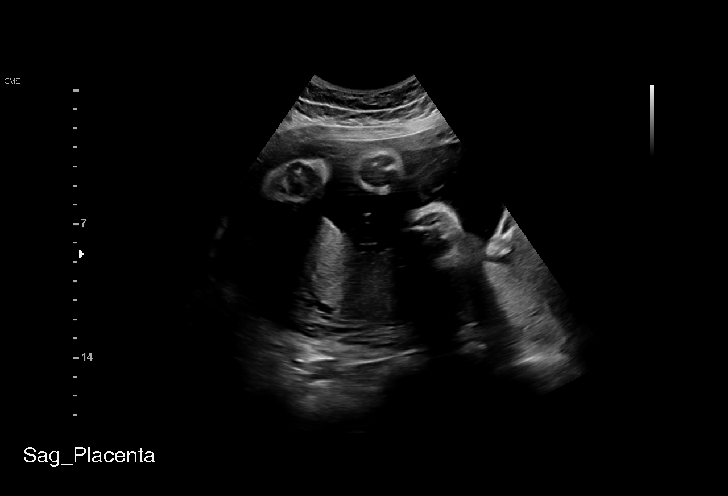
[im 22/85]
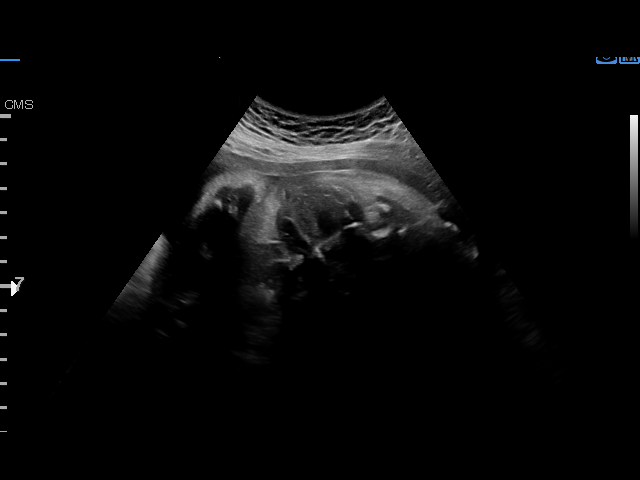
[im 29/85]
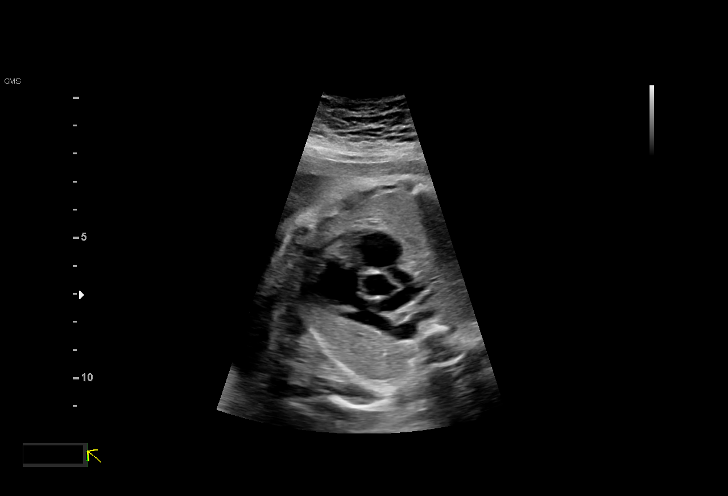
[im 35/85]
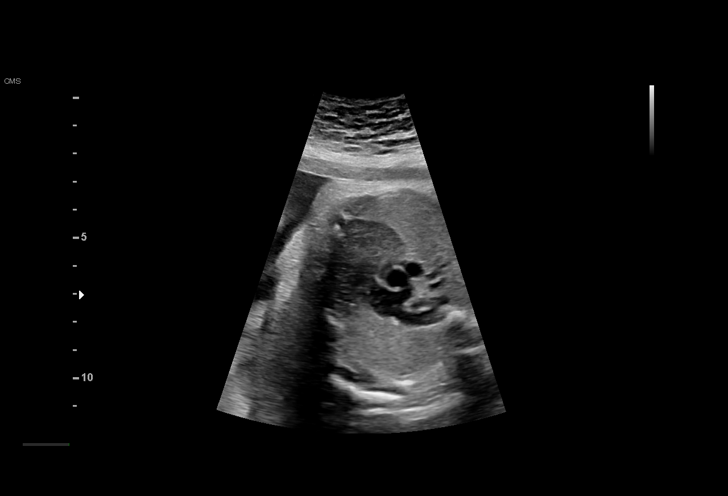
[im 44/85]
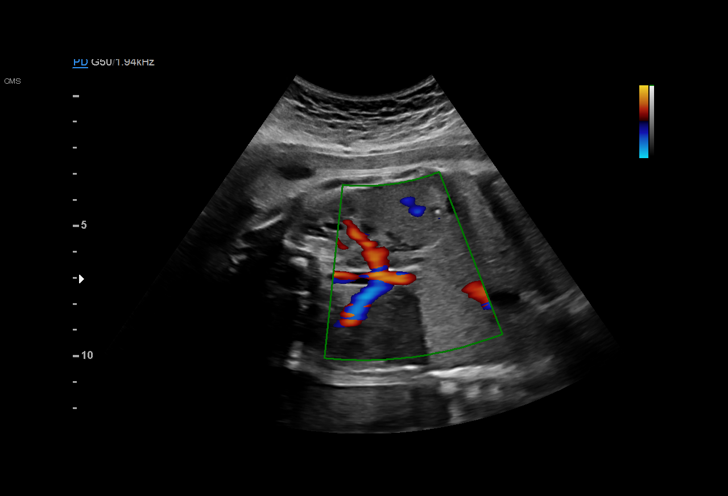
[im 50/85]
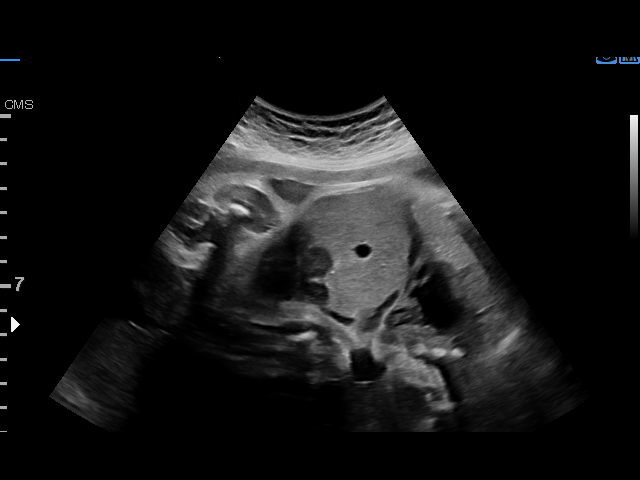
[im 57/85]
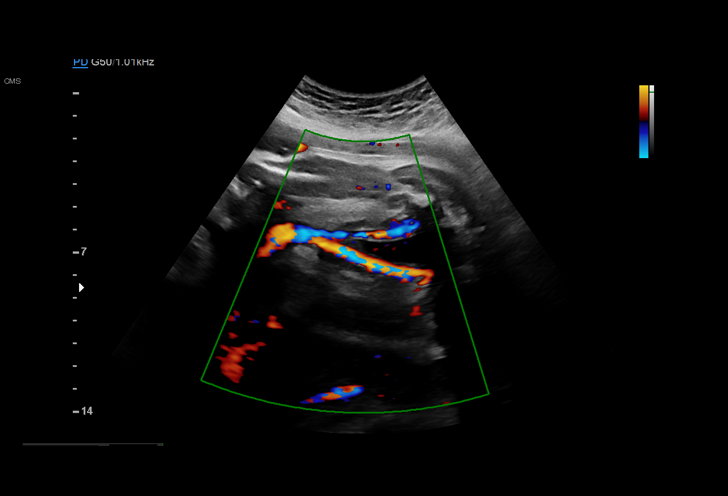
[im 63/85]
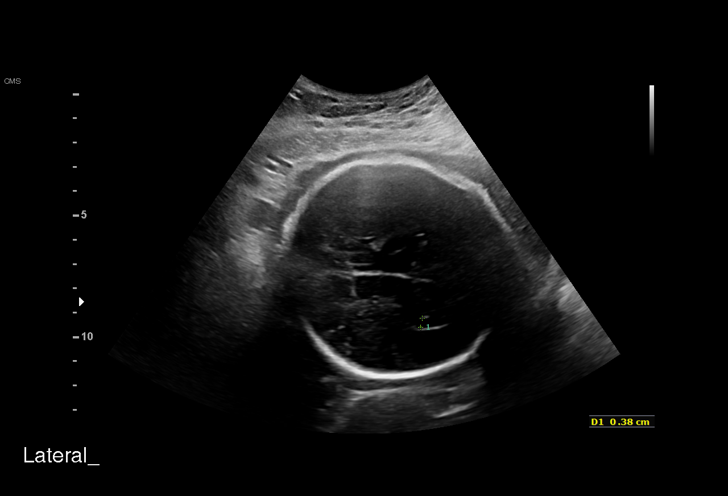
[im 69/85]
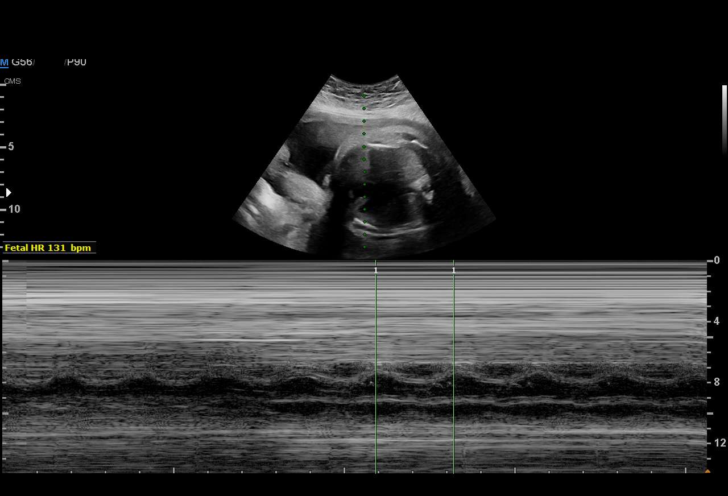
[im 75/85]
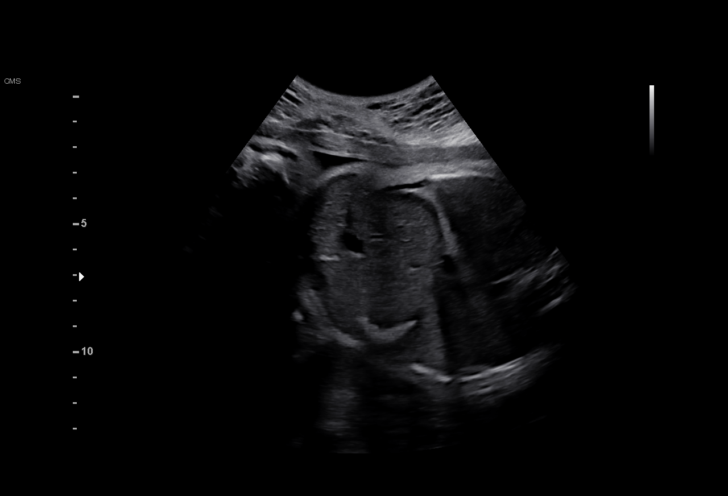
[im 81/85]
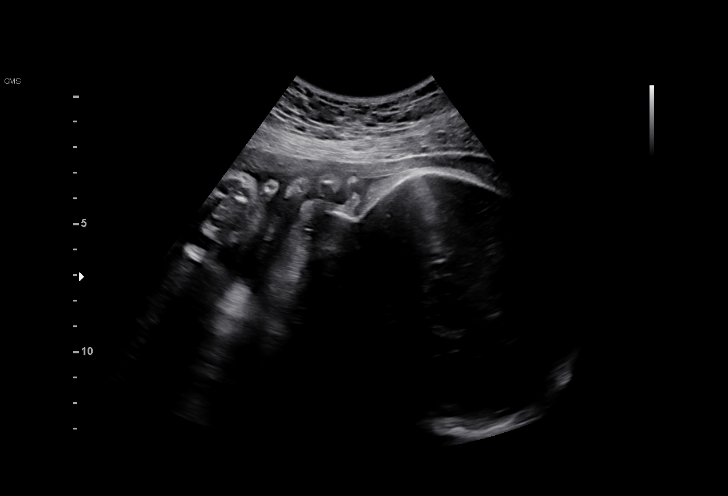

[13 of 28 positions shown; findings below may reference images not displayed]

OB/GYN &
                                                            Infertility
                                                            [id number] [HOSPITAL]

Indications

 Maternal viral disease, antepartum, CMV
 (IgM 90)
 Encounter for other antenatal screening
 follow-up
 Advanced maternal age multigravida 35+,
 third trimester (low risk NIPS)
 Hypertension - Chronic/Pre-existing
 (labetalol)
 Fetal abnormality - other known or
 suspected (multiple abdominal calcificaitons)
 33 weeks gestation of pregnancy
Fetal Evaluation

 Num Of Fetuses:         1
 Fetal Heart Rate(bpm):  131
 Cardiac Activity:       Observed
 Presentation:           Cephalic
 Placenta:               Posterior
 P. Cord Insertion:      Visualized

 Amniotic Fluid
 AFI FV:      Within normal limits

 AFI Sum(cm)     %Tile       Largest Pocket(cm)
 15.5            55
 RUQ(cm)       RLQ(cm)       LUQ(cm)        LLQ(cm)

Biometry

 BPD:      90.4  mm     G. Age:  36w 4d         99  %    CI:        79.84   %    70 - 86
                                                         FL/HC:      19.4   %    19.4 -
 HC:      319.7  mm     G. Age:  36w 0d         76  %    HC/AC:      1.05        0.96 -
 AC:      305.1  mm     G. Age:  34w 3d         77  %    FL/BPD:     68.6   %    71 - 87
 FL:         62  mm     G. Age:  32w 1d         10  %    FL/AC:      20.3   %    20 - 24
 HUM:      53.9  mm     G. Age:  31w 2d         14  %

 Est. FW:    1691  gm      5 lb 4 oz     62  %
OB History

 Gravidity:    2         Term:   1        Prem:   0        SAB:   0
 TOP:          0       Ectopic:  0        Living: 1
Gestational Age

 LMP:           33w 4d        Date:  01/31/19                 EDD:   11/07/19
 U/S Today:     34w 6d                                        EDD:   10/29/19
 Best:          33w 4d     Det. By:  LMP  (01/31/19)          EDD:   11/07/19
Anatomy

 Cranium:               Appears normal         Aortic Arch:            Previously seen
 Cavum:                 Appears normal         Ductal Arch:            Appears normal
 Ventricles:            Appears normal         Diaphragm:              Appears normal
 Choroid Plexus:        Appears normal         Stomach:                Appears normal, left
                                                                       sided
 Cerebellum:            Appears normal         Abdomen:                Multiple
                                                                       calcifications
 Posterior Fossa:       Previously seen        Abdominal Wall:         Previously seen
 Nuchal Fold:           Not applicable (>20    Cord Vessels:           Appears normal (3
                        wks GA)                                        vessel cord)
 Face:                  Appears normal         Kidneys:                Appear normal
                        (orbits and profile)
 Lips:                  Previously seen        Bladder:                Appears normal
 Thoracic:              Appears normal         Spine:                  Previously seen
 Heart:                 Appears normal         Upper Extremities:      Previously seen
                        (4CH, axis, and
                        situs)
 RVOT:                  Appears normal         Lower Extremities:      Previously seen
 LVOT:                  Appears normal

 Other:  Heels/feet and open hands/5th digits previously visualized. Nasal
         bone visualized.
Cervix Uterus Adnexa

 Cervix
 Not visualized (advanced GA >27wks)

 Uterus
 No abnormality visualized.
 Right Ovary
 Not visualized.

 Left Ovary
 Not visualized.

 Cul De Sac
 No free fluid seen.

 Adnexa
 No abnormality visualized.
Comments

 This patient was seen for a follow up growth scan due to a
 possible congenital CMV infection and chronic hypertension.
 The patient reports that she is undergoing weekly fetal testing
 in your office.  She denies any problems since her last exam.
 She was informed that the fetal growth and amniotic fluid
 level appears appropriate for her gestational age.
 The fetal abdominal calcification primarily located within the
 fetal liver continues to be noted today.  She was advised that
 her baby should be examined after birth to determine if a
 congenital CMV infection is present.
 A follow up exam was scheduled in 4 weeks.  She should
 continue weekly fetal testing in your office until delivery.

## 2022-02-12 MED ORDER — NIFEDIPINE ER OSMOTIC RELEASE 30 MG PO TB24: 30.0000 mg | ORAL_TABLET | Freq: Every day | ORAL | 1 refills | Status: AC

## 2022-02-12 MED ORDER — FUROSEMIDE 20 MG PO TABS: 20.0000 mg | ORAL_TABLET | Freq: Two times a day (BID) | ORAL | 0 refills | Status: AC

## 2022-02-12 MED ORDER — ESCITALOPRAM OXALATE 10 MG PO TABS: 10.0000 mg | ORAL_TABLET | Freq: Every day | ORAL | 1 refills | Status: AC

## 2022-02-12 NOTE — Progress Notes (Signed)
   Subjective:    Patient ID: Margaret Ryan, female    DOB: [birth year], 37 y.o.   MRN: [id number]  HPI Patient is 5 days postpartum from an uncomplicated vaginal delivery.  Her pregnancy was complicated by chronic hypertension that was controlled without medications.  She was prescribed Lasix.  Patient is having a lot of emotions currently with a lot of stress.  Patient is having a difficult time coping with everything.  Feels like she has support, but has difficulty asking for help.  No suicidal ideation or homicidal ideation   Review of Systems     Objective:   Physical Exam Vitals reviewed.  Constitutional:      Appearance: Normal appearance.  Abdominal:     General: Abdomen is flat.     Palpations: Abdomen is soft.  Skin:    General: Skin is warm and dry.     Capillary Refill: Capillary refill takes less than 2 seconds.  Neurological:     General: No focal deficit present.     Mental Status: She is alert.  Psychiatric:        Mood and Affect: Mood normal.        Behavior: Behavior normal.        Thought Content: Thought content normal.        Judgment: Judgment normal.     Comments: Patient very emotional during visit        Assessment & Plan:  1. Chronic hypertension complicating pregnancy, antepartum Start procardia  2. Postpartum depression Start lexapro after discussing pros and cons of treatment with patient.  Discussed potential side effects to medication.  Will follow-up with virtual appointment in 2 weeks.  Patient to notify if condition worsens before then

## 2022-02-19 ENCOUNTER — Encounter: Payer: Medicaid Other | Admitting: Family Medicine

## 2022-02-20 ENCOUNTER — Inpatient Hospital Stay (HOSPITAL_COMMUNITY): Payer: Medicaid Other

## 2022-02-27 ENCOUNTER — Telehealth (INDEPENDENT_AMBULATORY_CARE_PROVIDER_SITE_OTHER): Payer: Medicaid Other | Admitting: Family Medicine

## 2022-02-27 DIAGNOSIS — F53 Postpartum depression: Secondary | ICD-10-CM

## 2022-02-27 NOTE — Progress Notes (Signed)
Mood follow up: postpartum depression. Margaret Alu RN

## 2022-02-27 NOTE — Progress Notes (Signed)
GYNECOLOGY VIRTUAL VISIT ENCOUNTER NOTE  [facility]   Patient location: Home  I connected with Margaret Ryan on 02/27/22 at 10:15 AM EST by MyChart Video Encounter and verified that I am speaking with the correct person using two identifiers.   I discussed the limitations, risks, security and privacy concerns of performing an evaluation and management service virtually and the availability of in person appointments. I also discussed with the patient that there may be a patient responsible charge related to this service. The patient expressed understanding and agreed to proceed.   History:  Margaret Ryan is a 37 y.o. [phone] female being evaluated today for follow-up of postpartum depression.  She is approximately 3 weeks postpartum from a vaginal delivery.  I saw her 2 weeks ago and started her on Lexapro and Procardia.  She felt lightheaded and foggy headed with taking both of those medications together, so she stopped taking them both.  She has noticed that her mood is up-and-down still, feeling overwhelmed at times.  She restarted the Lexapro this morning. She denies any abnormal vaginal discharge, bleeding, pelvic pain or other concerns.       Past Medical History:  Diagnosis Date   Anxiety    Complication of anesthesia    She thinks itching after general anesth w/tonsillectomy   Hypertension    Past Surgical History:  Procedure Laterality Date   TONSILLECTOMY     The following portions of the patient's history were reviewed and updated as appropriate: allergies, current medications, past family history, past medical history, past social history, past surgical history and problem list.    Review of Systems:  Pertinent items noted in HPI and remainder of comprehensive ROS otherwise negative.  Physical Exam:   General:  Alert, oriented and cooperative. Patient appears to be in no acute distress.   Mental Status: Normal mood and affect. Normal behavior. Normal judgment and thought content.   Respiratory: Normal respiratory effort, no problems with respiration noted  Rest of physical exam deferred due to type of encounter  Labs and Imaging No results found for this or any previous visit (from the past 336 hour(s)). US MFM FETAL BPP W/NONSTRESS  Result Date: 02/07/2022 ----------------------------------------------------------------------  OBSTETRICS REPORT                       (Signed Final 02/07/2022 10:58 am) ---------------------------------------------------------------------- Patient Info  ID #:       [id number]                          D.O.B.:  Jun 19, 1985 (36 yrs)  Name:       Margaret MccallumJILL MARIE [facility]            Visit Date: 02/07/2022 09:44 am ---------------------------------------------------------------------- Performed By  Attending:        Braxton FeathersBurk Schaible DO       Secondary Phy.:   Levie HeritageJACOB J                                                             Nyssa Sayegh MD  Performed By:     Eden Lathearrie Stalter BS      Address:  [address]                    [address]                                                             [place], [place]                                                             [address]  Referred By:      [facility]         Location:         [facility]  Ref. Address:     [address] ---------------------------------------------------------------------- Orders  #  Description                           Code        Ordered By  1  [place] MFM FETAL BPP                      60454.0     Rosana Hoes     W/NONSTRESS ----------------------------------------------------------------------  #  Order #                     Accession #                Episode #  1   [id number]                   [id number]                 [id number] ---------------------------------------------------------------------- Indications  Hypertension - Chronic/Pre-existing (No        O10.019  Meds)  Obesity complicating pregnancy, third          O99.213  trimester (BMI 42)  Advanced  maternal age multigravida 2335+,        4O09.522  second trimester (35 yrs)  Pyelectasis of fetus on prenatal ultrasound    O28.3  (resolved)  Low Risk NIPS  [redacted] weeks gestation of pregnancy                Z3A.37  BPP 6/10                                       O35.8XX0 ---------------------------------------------------------------------- Fetal Evaluation  Num Of Fetuses:         1  Fetal Heart Rate(bpm):  149  Cardiac Activity:       Observed  Presentation:           Cephalic  Placenta:               Anterior  P. Cord Insertion:      Previously visualized  Amniotic Fluid  AFI FV:      Within normal limits  AFI Sum(cm)     %Tile       Largest Pocket(cm)  11.1            33          5  RUQ(cm)       RLQ(cm)       LUQ(cm)  5             3.3           2.8 ---------------------------------------------------------------------- Biophysical Evaluation  Amniotic F.V:   Within normal limits       F. Tone:        Not Observed  F. Movement:    Not Observed               N.S.T:          Reactive  F. Breathing:   Observed                   Score:          6/10 ---------------------------------------------------------------------- OB History  Gravidity:    3         Term:   2        Prem:   0        SAB:   0  TOP:          0       Ectopic:  0        Living: 2 ---------------------------------------------------------------------- Gestational Age  LMP:           38w 6d        Date:  05/11/21                   EDD:   02/15/22  Best:          Richarda Osmond37w 2d     Det. ByMarcella Dubs:  Early Ultrasound         EDD:   02/26/22                                      (08/01/21) ---------------------------------------------------------------------- Anatomy  Heart:                  Appears normal         Kidneys:  Appear normal                         (4CH, axis, and                         situs)  Diaphragm:             Appears normal         Bladder:                Appears normal  Stomach:               Appears normal, left                         sided ---------------------------------------------------------------------- Comments  MFM Consult Note  Patient Name: SYA NESTLER  Patient MRN:   [id number]  Referring provider: [place]  Reason for Consult: BPP 6/10  HPI: Maylyn MARIE HENRIE is a 37 y.o. G3P2002 at  [redacted]w[redacted]d here for ultrasound and consultation.  The patient was receiving a BPP today in the office for  chronic hypertension, AMA and elevated BMI.  BPP was 6 out  of 10, -2 for fetal tone and movement.  The NST was reactive.  I discussed delivery after 37 weeks in the setting of a BPP  that 6 out of 10.  The patient verbalized understanding and  will head directly to the hospital.  I have notified the on-call  provider as well.  Review of Systems: A review of systems was performed and  was negative except per HPI  Vitals and Physical Exam  See intake sheet for vitals  Sitting comfortably on the sonogram table  Nonlabored breathing  Normal rate and rhythm  Abdomen is nontender  Genetic testing: Normal  Ultrasound findings  Single intrauterine pregnancy.  Fetal cardiac activity: Observed.  Presentation: Cephalic.  Interval fetal anatomy appears normal.  Amniotic fluid volume: Within normal limits. AFI: 11.1 cm.  MVP: 5. cm.  Placenta: Anterior.  BPP: 6/10  Assessment  - CHTN (no meds)  - BMI 37  - AMA 35y  - BPP 6/10  Plan  -I discussed the concern for fetal hypoxia and stillbirth in the  setting of a BPP of 6 out of 10 and indication for delivery.  Patient is able to verbalize understanding and is agreeable to  induction.  She was sent to the hospital for delivery.  I spent 30 minutes reviewing the patients chart, including labs  and images as well as  counseling the patient about her  medical conditions.  Braxton Feathers  MFM, [place]  02/07/2022  10:48 AM ----------------------------------------------------------------------                 Braxton Feathers, DO Electronically Signed Final Report   02/07/2022 10:58 am ----------------------------------------------------------------------  [place] MFM FETAL BPP WO NON STRESS  Result Date: 01/31/2022 ----------------------------------------------------------------------  OBSTETRICS REPORT                       (Signed Final 01/31/2022 08:53 am) ---------------------------------------------------------------------- Patient Info  ID #:       [id number]                          D.O.B.:  July 24, 1985 (36 yrs)  Name:       Margaret Schwalbe  Visit Date: 01/31/2022 07:18 am ---------------------------------------------------------------------- Performed By  Attending:        Tama High MD        Secondary Phy.:   Truett Mainland MD  Performed By:     Stephenie Acres        Address:          [place], [place]  Referred By:      [facility]         Location:         [facility]                                                             [facility]                                                             [facility]  Ref. Address:     [place] ---------------------------------------------------------------------- Orders  #  Description                           Code        Ordered By  1  [place] MFM FETAL BPP WO NON               07622.63    YU FANG     STRESS  2  [place] MFM OB FOLLOW UP  40102.7276816.01    Rosana HoesYU FANG ----------------------------------------------------------------------   #  Order #                     Accession #                Episode #  1  [id number]                   [id number]                 [id number]  2  [id number]                   [id number]                 [id number] ---------------------------------------------------------------------- Indications  Hypertension - Chronic/Pre-existing (No        O10.019  Meds)  Obesity complicating pregnancy, third          O99.213  trimester (BMI 42)  Advanced maternal age multigravida 8035+,        62O09.522  second trimester (35 yrs)  Pyelectasis of fetus on prenatal ultrasound    O28.3  (resolved)  Low Risk NIPS  [redacted] weeks gestation of pregnancy                Z3A.36 ---------------------------------------------------------------------- Fetal Evaluation  Num Of Fetuses:         1  Fetal Heart Rate(bpm):  139  Cardiac Activity:       observed  Presentation:           cephalic  Placenta:               anterior  P. Cord Insertion:      previously visualized  Amniotic Fluid  AFI FV:      within normal limits  AFI Sum(cm)     %Tile       Largest Pocket(cm)  9.7             20          3.6  RUQ(cm)       RLQ(cm)       LUQ(cm)        LLQ(cm)  0.5           3.5           3.6            2.1 ---------------------------------------------------------------------- Biophysical Evaluation  Amniotic F.V:   Pocket => 2 cm             F. Tone:        Observed  F. Movement:    Observed                   Score:          8/8  F. Breathing:   Observed ---------------------------------------------------------------------- Biometry  BPD:      88.3  mm     G. Age:  35w 5d         43  %    CI:        74.47   %    70 - 86  FL/HC:      21.3   %    20.1 - 22.1  HC:      324.8  mm     G. Age:  36w 5d         32  %    HC/AC:      0.93        0.93 - 1.11  AC:      350.8  mm     G. Age:  39w 0d         99  %    FL/BPD:     78.5   %    71 - 87  FL:       69.3  mm     G. Age:  35w 4d         28  %    FL/AC:      19.8   %     20 - 24  LV:        2.9  mm  Est. FW:    3245  gm      7 lb 2 oz     84  % ---------------------------------------------------------------------- OB History  Gravidity:    3         Term:   2        Prem:   0        SAB:   0  TOP:          0       Ectopic:  0        Living: 2 ---------------------------------------------------------------------- Gestational Age  LMP:           37w 6d        Date:  05/11/21                   EDD:   02/15/22  U/S Today:     36w 5d                                        EDD:   02/23/22  Best:          36w 2d     Det. By:  Marcella Dubs         EDD:   02/26/22                                      (08/01/21) ---------------------------------------------------------------------- Anatomy  Cranium:               Appears normal         LVOT:                   Appears normal  Cavum:                 Appears normal         Aortic Arch:            Previously seen  Ventricles:            Appears normal         Ductal Arch:            Previously seen  Choroid Plexus:        Previously seen        Diaphragm:  Previously seen  Cerebellum:            Previously seen        Stomach:                Appears normal, left                                                                        sided  Posterior Fossa:       Previously seen        Abdomen:                Appears normal  Nuchal Fold:           Not applicable (>20    Abdominal Wall:         Not well visualized                         wks GA)  Face:                  Orbits and profile     Cord Vessels:           Previously seen                         previously seen  Lips:                  Previously seen        Kidneys:                Appear normal  Palate:                Not well visualized    Bladder:                Appears normal  Thoracic:              Previously seen        Spine:                  Previously seen  Heart:                 Previously seen        Upper Extremities:      Previously seen  RVOT:                   Previously seen        Lower Extremities:      Previously seen  Other:  VC, 3VV, 3VTV, and Heels previously visualized. Technically difficult          due to maternal habitus and fetal position. ---------------------------------------------------------------------- Impression  Chronic hypertension.  Patient does not take  antihypertensives.  Blood pressure today at her office is  137/93 mmHg.  Prepregnancy BMI 42.  Fetal growth is appropriate for gestational age .Amniotic fluid  is normal and good fetal activity is seen .Antenatal testing is  reassuring. BPP 8/8. Cephalic presentation. ---------------------------------------------------------------------- Recommendations  -BPP next week.  -NST at prenatal visit on 02/12/21 at your office. ----------------------------------------------------------------------                  Noralee Space, MD  Electronically Signed Final Report   01/31/2022 08:53 am ----------------------------------------------------------------------  US MFM OB FOLLOW UP  Result Date: 01/31/2022 ----------------------------------------------------------------------  OBSTETRICS REPORT                       (Signed Final 01/31/2022 08:53 am) ---------------------------------------------------------------------- Patient Info  ID #:       [id number]                          D.O.B.:  1985-10-18 (36 yrs)  Name:       Margaret Ryan            Visit Date: 01/31/2022 07:18 am ---------------------------------------------------------------------- Performed By  Attending:        Noralee Spaceavi Shankar MD        Secondary Phy.:   Levie HeritageJACOB J                                                             Wladyslawa Disbro MD  Performed By:     Isac Sarnaaisy Hernandez        Address:          [address]                    [address]                                                             [place], [place]                                                             [id number]  [facility]:      [facility] [place]         Location[facility]         [facility]  Ref. Address:     [address] ---------------------------------------------------------------------- Orders  #  Description                           Code        Ordered By  1  [place] MFM FETAL BPP WO NON               76819.01    YU FANG     STRESS  2  [place] MFM OB FOLLOW UP                   [id number]    YU FANG ----------------------------------------------------------------------  #  Order #                     Accession #                Episode #  1  [id number]                   [id number]                 [id number]  2  [id number]                   [id number]                 [id number] ---------------------------------------------------------------------- Indications  Hypertension - Chronic/Pre-existing (No        O10.019  Meds)  Obesity complicating pregnancy, third          O99.213  trimester (BMI 42)  Advanced maternal age multigravida 19+,        O24.522  second trimester (35 yrs)  Pyelectasis of fetus on prenatal ultrasound    O28.3  (resolved)  Low Risk NIPS  [redacted] weeks gestation of pregnancy                Z3A.36 ---------------------------------------------------------------------- Fetal Evaluation  Num Of Fetuses:         1  Fetal Heart Rate(bpm):  139  Cardiac Activity:       observed  Presentation:           cephalic  Placenta:               anterior  P. Cord Insertion:      previously visualized  Amniotic Fluid  AFI FV:      within normal limits  AFI Sum(cm)     %Tile       Largest Pocket(cm)  9.7             20          3.6  RUQ(cm)       RLQ(cm)       LUQ(cm)        LLQ(cm)  0.5           3.5           3.6            2.1 ---------------------------------------------------------------------- Biophysical Evaluation  Amniotic F.V:   Pocket => 2 cm             F.  Tone:        Observed  F. Movement:    Observed  Score:          8/8  F. Breathing:   Observed ---------------------------------------------------------------------- Biometry  BPD:      88.3  mm     G. Age:  35w 5d         43  %    CI:        74.47   %    70 - 86                                                          FL/HC:      21.3   %    20.1 - 22.1  HC:      324.8  mm     G. Age:  36w 5d         32  %    HC/AC:      0.93        0.93 - 1.11  AC:      350.8  mm     G. Age:  39w 0d         99  %    FL/BPD:     78.5   %    71 - 87  FL:       69.3  mm     G. Age:  35w 4d         28  %    FL/AC:      19.8   %    20 - 24  LV:        2.9  mm  Est. FW:    3245  gm      7 lb 2 oz     84  % ---------------------------------------------------------------------- OB History  Gravidity:    3         Term:   2        Prem:   0        SAB:   0  TOP:          0       Ectopic:  0        Living: 2 ---------------------------------------------------------------------- Gestational Age  LMP:           37w 6d        Date:  05/11/21                   EDD:   02/15/22  U/S Today:     36w 5d                                        EDD:   02/23/22  Best:          36w 2d     Det. ByMarcella Dubs         EDD:   02/26/22                                      (08/01/21) ---------------------------------------------------------------------- Anatomy  Cranium:               Appears normal  LVOT:                   Appears normal  Cavum:                 Appears normal         Aortic Arch:            Previously seen  Ventricles:            Appears normal         Ductal Arch:            Previously seen  Choroid Plexus:        Previously seen        Diaphragm:              Previously seen  Cerebellum:            Previously seen        Stomach:                Appears normal, left                                                                        sided  Posterior Fossa:       Previously seen        Abdomen:                 Appears normal  Nuchal Fold:           Not applicable (>20    Abdominal Wall:         Not well visualized                         wks GA)  Face:                  Orbits and profile     Cord Vessels:           Previously seen                         previously seen  Lips:                  Previously seen        Kidneys:                Appear normal  Palate:                Not well visualized    Bladder:                Appears normal  Thoracic:              Previously seen        Spine:                  Previously seen  Heart:                 Previously seen        Upper Extremities:      Previously seen  RVOT:                  Previously seen  Lower Extremities:      Previously seen  Other:  VC, 3VV, 3VTV, and Heels previously visualized. Technically difficult          due to maternal habitus and fetal position. ---------------------------------------------------------------------- Impression  Chronic hypertension.  Patient does not take  antihypertensives.  Blood pressure today at her office is  137/93 mmHg.  Prepregnancy BMI 42.  Fetal growth is appropriate for gestational age .Amniotic fluid  is normal and good fetal activity is seen .Antenatal testing is  reassuring. BPP 8/8. Cephalic presentation. ---------------------------------------------------------------------- Recommendations  -BPP next week.  -NST at prenatal visit on 02/12/21 at your office. ----------------------------------------------------------------------                  Tama High, MD Electronically Signed Final Report   01/31/2022 08:53 am ----------------------------------------------------------------------      Assessment and Plan:     1. Postpartum depression Continue with lexapro.  Patient will monitor symptoms over the next 3 to 4 days to see if her side effects return.  She will follow-up with me in 4 weeks for postpartum visit.  Will have her follow-up sooner if having any other problems.       I discussed the  assessment and treatment plan with the patient. The patient was provided an opportunity to ask questions and all were answered. The patient agreed with the plan and demonstrated an understanding of the instructions.   The patient was advised to call back or seek an in-person evaluation/go to the ED if the symptoms worsen or if the condition fails to improve as anticipated.  I provided 11 minutes of face-to-face time during this encounter.   [place] for [employer], [place]

## 2022-03-06 DIAGNOSIS — Z419 Encounter for procedure for purposes other than remedying health state, unspecified: Secondary | ICD-10-CM | POA: Diagnosis not present

## 2022-03-12 IMAGING — US US MFM OB FOLLOW-UP
1 series · 13 of 28 positions shown · non-contrast
Comparison: none

[Series 1: us mfm ob follow-up · 42 acquisitions, 13 frames shown]
[im 2/42]
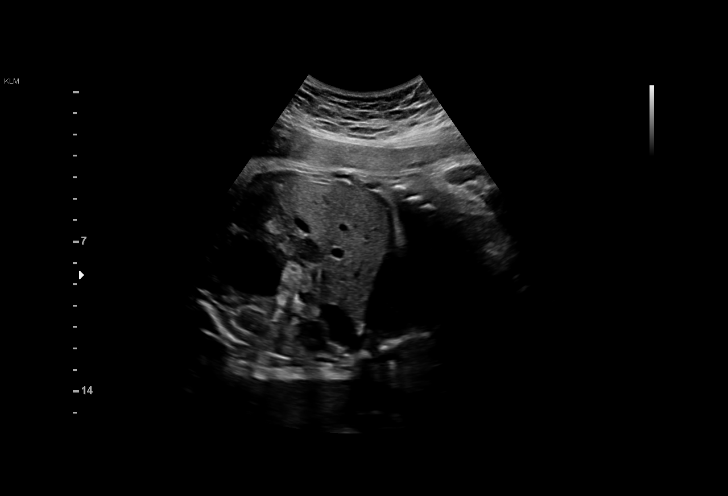
[im 5/42]
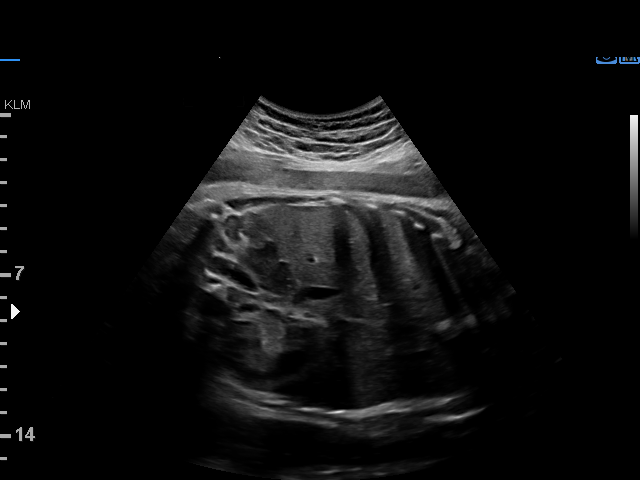
[im 8/42]
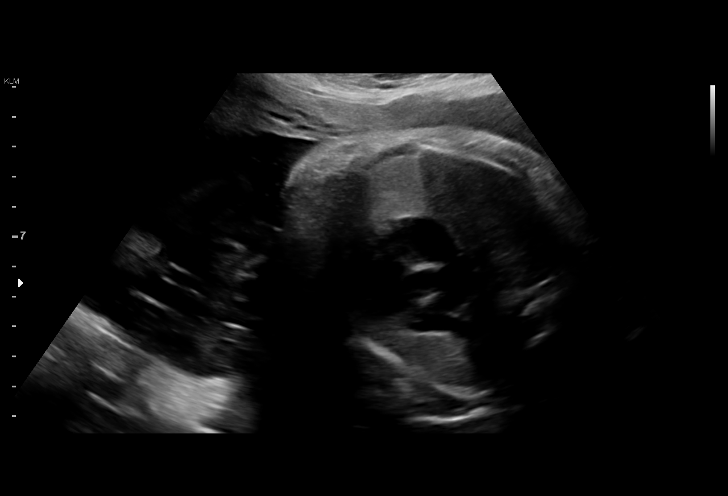
[im 11/42]
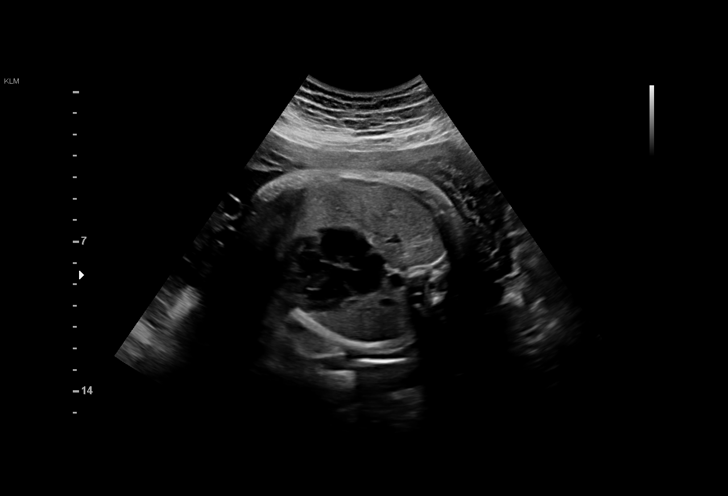
[im 14/42]
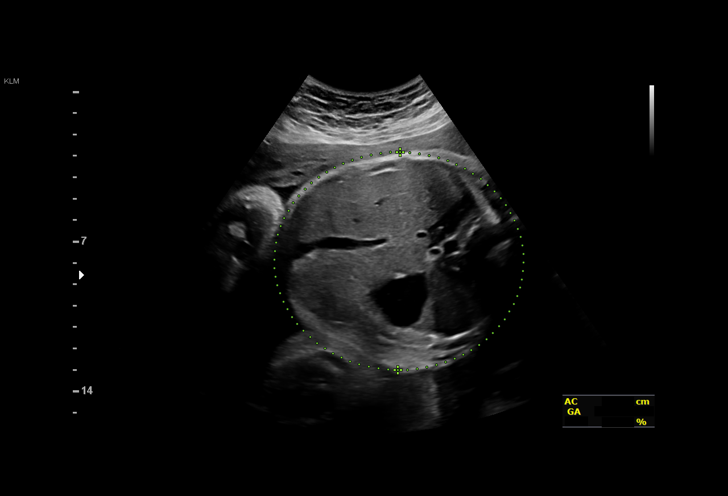
[im 17/42]
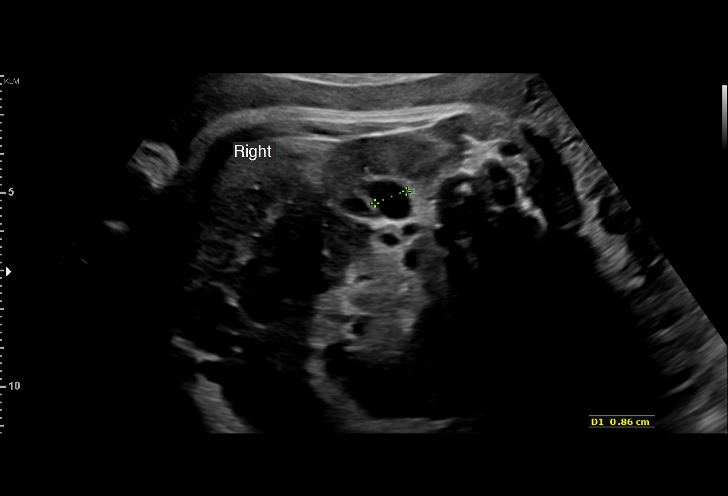
[im 22/42]
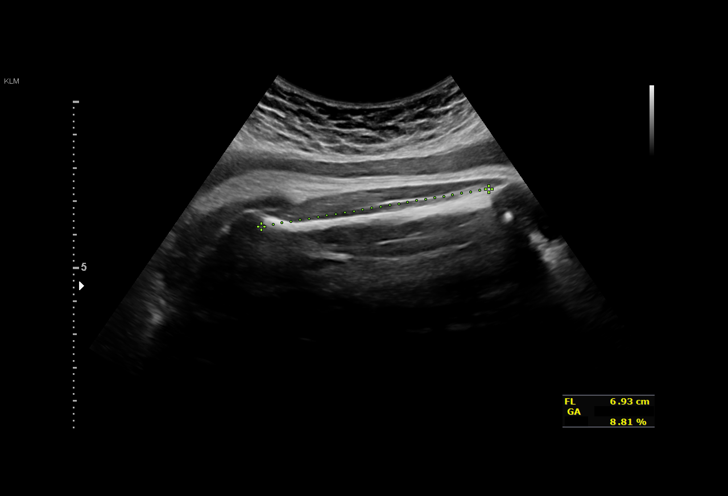
[im 25/42]
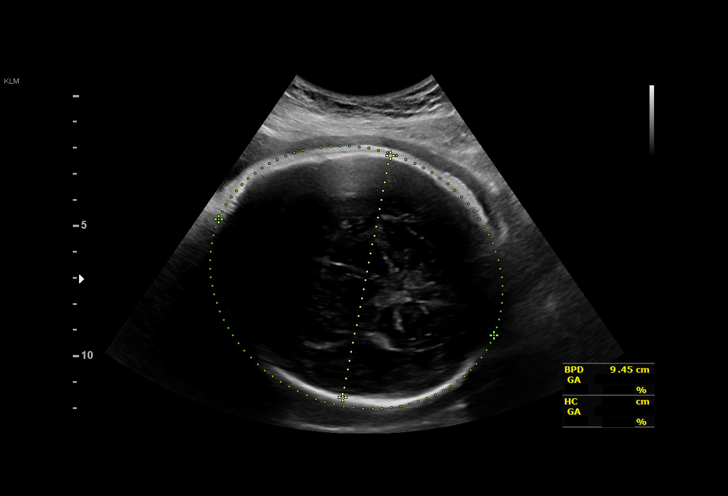
[im 28/42]
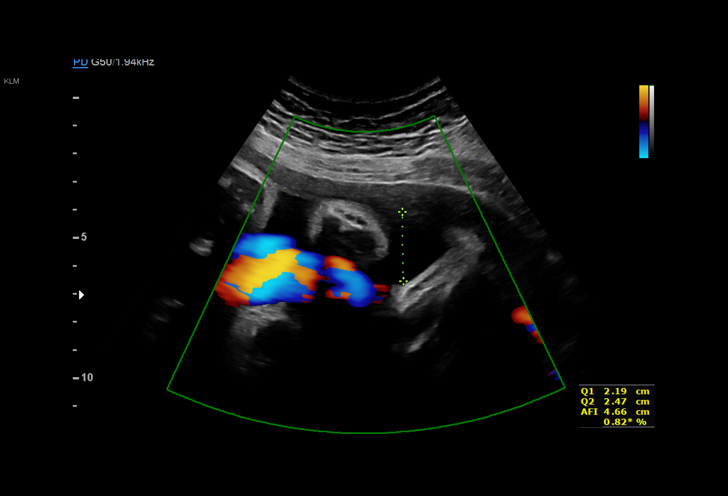
[im 31/42]
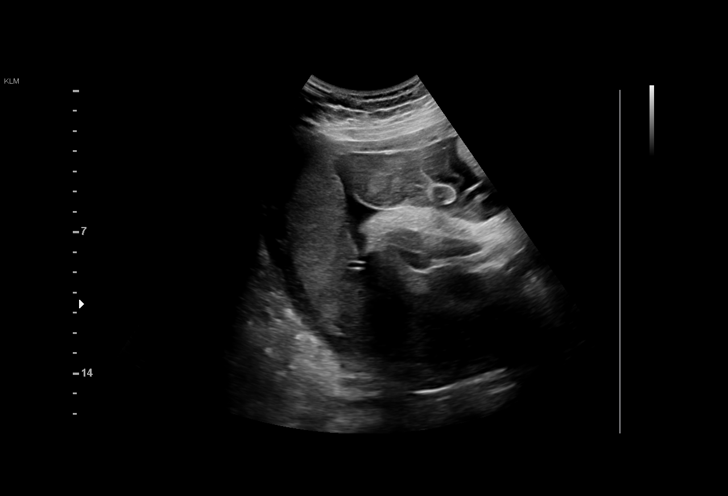
[im 34/42]
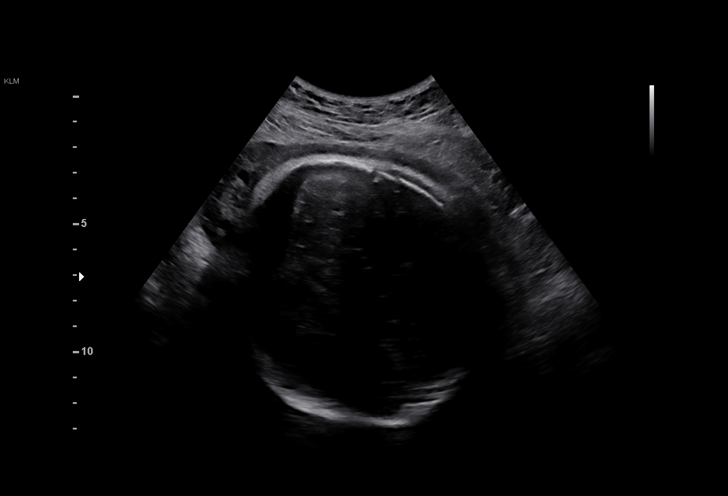
[im 37/42]
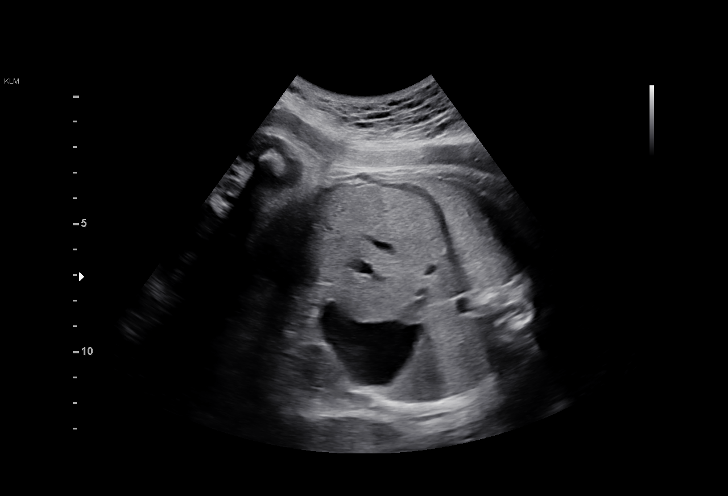
[im 40/42]
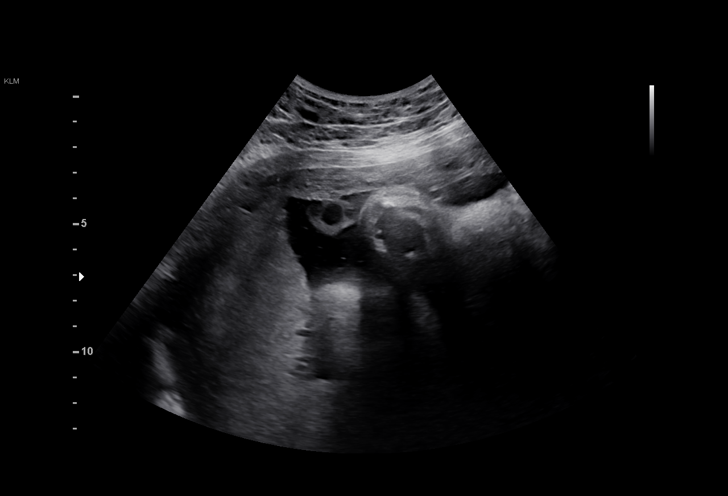

[13 of 28 positions shown; findings below may reference images not displayed]

OB/GYN &
                                                            Infertility
                                                            [id number] [HOSPITAL]

Indications

 Advanced maternal age multigravida 35+,
 third trimester (low risk NIPS)
 Hypertension - Chronic/Pre-existing
 (labetalol)
 37 weeks gestation of pregnancy
 Maternal viral disease, antepartum, CMV
 (IgM 90)
 Fetal abnormality - other known or
 suspected (multiple abdominal calcificaitons)
 Encounter for other antenatal screening
 follow-up
Fetal Evaluation

 Num Of Fetuses:         1
 Fetal Heart Rate(bpm):  135
 Cardiac Activity:       Observed
 Presentation:           Cephalic
 Placenta:               Posterior
 P. Cord Insertion:      Previously Visualized
 Amniotic Fluid
 AFI FV:      Within normal limits

 AFI Sum(cm)     %Tile       Largest Pocket(cm)
 8.51            14

 RUQ(cm)       RLQ(cm)       LUQ(cm)        LLQ(cm)

Biophysical Evaluation

 Amniotic F.V:   Within normal limits       F. Tone:        Observed
 F. Movement:    Observed                   Score:          [DATE]
 F. Breathing:   Observed
Biometry

 BPD:      94.4  mm     G. Age:  38w 3d         88  %    CI:        79.51   %    70 - 86
                                                         FL/HC:      20.4   %    20.9 -
 HC:      334.6  mm     G. Age:  38w 2d         44  %    HC/AC:      0.97        0.92 -
 AC:      345.1  mm     G. Age:  38w 3d         85  %    FL/BPD:     72.2   %    71 - 87
 FL:       68.2  mm     G. Age:  35w 0d        4.3  %    FL/AC:      19.8   %    20 - 24

 Est. FW:    5221  gm      7 lb 3 oz     61  %
OB History

 Gravidity:    2         Term:   1        Prem:   0        SAB:   0
 TOP:          0       Ectopic:  0        Living: 1
Gestational Age

 LMP:           37w 4d        Date:  01/31/19                 EDD:   11/07/19
 U/S Today:     37w 4d                                        EDD:   11/07/19
 Best:          37w 4d     Det. By:  LMP  (01/31/19)          EDD:   11/07/19
Anatomy

 Cranium:               Appears normal         Aortic Arch:            Previously seen
 Cavum:                 Previously seen        Ductal Arch:            Appears normal
 Ventricles:            Appears normal         Diaphragm:              Appears normal
 Choroid Plexus:        Previously seen        Stomach:                Appears normal, left
                                                                       sided
 Cerebellum:            Previously seen        Abdomen:                Multiple
                                                                       calcifications
 Posterior Fossa:       Previously seen        Abdominal Wall:         Previously seen
 Nuchal Fold:           Not applicable (>20    Cord Vessels:           Previously seen
                        wks GA)
 Face:                  Orbits and profile     Kidneys:                Right sided
                        previously seen
                                                                       pyelectasis,
                                                                       8.6mm
 Lips:                  Previously seen        Bladder:                Appears normal
 Thoracic:              Appears normal         Spine:                  Previously seen
 Heart:                 Appears normal         Upper Extremities:      Previously seen
                        (4CH, axis, and
                        situs)
 RVOT:                  Appears normal         Lower Extremities:      Previously seen
 LVOT:                  Appears normal
 Other:  Heels/feet and open hands/5th digits previously visualized. Nasal
         bone visualized.
Cervix Uterus Adnexa

 Cervix
 Not visualized (advanced GA >92wks)
Comments

 This patient was seen for a follow up growth scan due to
 possible congenital CMV infection and chronic hypertension
 treated with labetalol.  The patient reports that she is
 undergoing weekly fetal testing in your office.  She denies
 any problems since her last exam.
 She was informed that the fetal growth and amniotic fluid
 level appears appropriate for her gestational age.
 Right pyelectasis measuring 0.8 cm dilated was noted today.
 The left fetal kidney appeared within normal limits.  A normal-
 appearing filled fetal bladder was noted.  The patient was
 advised to notify her pediatrician regarding the pyelectasis
 that was noted on her prenatal ultrasound.  Her pediatrician
 should order further imaging studies of the baby's kidneys
 after delivery.  She was reassured that mild pyelectasis noted
 on prenatal ultrasounds will often resolve after delivery.
 Her baby should be examined and tested after birth to
 determine if a congenital CMV infection is present.
 She is already scheduled for an induction of labor in 6 days.

## 2022-03-27 ENCOUNTER — Encounter: Payer: Self-pay | Admitting: Family Medicine

## 2022-03-27 ENCOUNTER — Ambulatory Visit (INDEPENDENT_AMBULATORY_CARE_PROVIDER_SITE_OTHER): Payer: Medicaid Other | Admitting: Family Medicine

## 2022-03-27 DIAGNOSIS — Z30430 Encounter for insertion of intrauterine contraceptive device: Secondary | ICD-10-CM | POA: Diagnosis not present

## 2022-03-27 DIAGNOSIS — F53 Postpartum depression: Secondary | ICD-10-CM | POA: Diagnosis not present

## 2022-03-27 DIAGNOSIS — O1093 Unspecified pre-existing hypertension complicating the puerperium: Secondary | ICD-10-CM

## 2022-03-27 DIAGNOSIS — Z30014 Encounter for initial prescription of intrauterine contraceptive device: Secondary | ICD-10-CM

## 2022-03-27 DIAGNOSIS — O10919 Unspecified pre-existing hypertension complicating pregnancy, unspecified trimester: Secondary | ICD-10-CM

## 2022-03-27 LAB — POCT URINE PREGNANCY: Preg Test, Ur: NEGATIVE

## 2022-03-27 MED ORDER — LEVONORGESTREL 20 MCG/DAY IU IUD
1.0000 | INTRAUTERINE_SYSTEM | Freq: Once | INTRAUTERINE | Status: AC
  Administered 2022-03-27: 1 via INTRAUTERINE

## 2022-03-27 NOTE — Progress Notes (Signed)
IUD Procedure Note Patient identified, informed consent performed, signed copy in chart, time out was performed.  Urine pregnancy test negative.  Speculum placed in the vagina.  Cervix visualized.  Cleaned with Betadine x 2.  Paracervical block placed with Lidocaine 2% with epinephrine 2m spread between the 12 o'clock, 4 o'clock, 8 o'clock positions. Cervix grasped anteriorly with a single tooth tenaculum.  Uterus sounded to 8 cm.  Mirena  IUD placed per manufacturer's recommendations.  Strings trimmed to 3 cm. Tenaculum was removed, good hemostasis noted.  Patient tolerated procedure well.   Patient given post procedure instructions and Mirena care card with expiration date.  Patient is asked to check IUD strings periodically and follow up in 4-6 weeks for IUD check.

## 2022-03-27 NOTE — Progress Notes (Signed)
[facility] Part[facility] Visit Note  English Margaret Ryan is a 37 y.o. [phone] female who presents for a postpartum visit. She is 6 weeks postpartum following a normal spontaneous vaginal delivery.  I have fully reviewed the prenatal and intrapartum course. The delivery was at 20 gestational weeks.  Anesthesia: epidural. Postpartum course has been normal. Baby is doing well. Baby is feeding by breast. Bleeding no bleeding. Bowel function is normal. Bladder function is normal. Patient is not sexually active. Contraception method is IUD. Postpartum depression screening: negative.   Upstream - 03/27/22 1408       Pregnancy Intention Screening   Does the patient want to become pregnant in the next year? No    Does the patient's partner want to become pregnant in the next year? No    Would the patient like to discuss contraceptive options today? Yes      Contraception Wrap Up   Current Method IUD or IUS    End Method IUD or IUS    Contraception Counseling Provided Yes    How was the end contraceptive method provided? Provided on site            The pregnancy intention screening data noted above was reviewed. Potential methods of contraception were discussed. The patient elected to proceed with IUD or IUS.   Edinburgh Postnatal Depression Scale - 03/27/22 1408       Edinburgh Postnatal Depression Scale:  In the Past 7 Days   I have been able to laugh and see the funny side of things. 0    I have looked forward with enjoyment to things. 0    I have blamed myself unnecessarily when things went wrong. 0    I have been anxious or worried for no good reason. 0    I have felt scared or panicky for no good reason. 0    Things have been getting on top of me. 0    I have been so unhappy that I have had difficulty sleeping. 0    I have felt sad or miserable. 0    I have been so unhappy that I have been crying. 0    The thought of harming myself has occurred to me. 0    Edinburgh Postnatal Depression  Scale Total 0             Health Maintenance Due  Topic Date Due   COVID-19 Vaccine (1) Never done   DTaP/Tdap/Td (1 - Tdap) Never done   INFLUENZA VACCINE  Never done    The following portions of the patient's history were reviewed and updated as appropriate: allergies, current medications, past family history, past medical history, past social history, past surgical history, and problem list.  Review of Systems Pertinent items are noted in HPI.  Objective:  BP 125/78   Pulse 63   Wt 239 lb (108.4 kg)   LMP 05/11/2021   Breastfeeding Yes   BMI 38.58 kg/m    General:  alert, cooperative, and no distress   Breasts:  not indicated  Lungs: clear to auscultation bilaterally  Heart:  regular rate and rhythm, S1, S2 normal, no murmur, click, rub or gallop  Abdomen: soft, non-tender; bowel sounds normal; no masses,  no organomegaly   Wound N/a  GU exam:  normal       Assessment:     1. Postpartum care and examination - POCT urine pregnancy - levonorgestrel (MIRENA) 20 MCG/DAY IUD 1 each  2. Encounter for  initial prescription of intrauterine contraceptive device (IUD) - POCT urine pregnancy - levonorgestrel (MIRENA) 20 MCG/DAY IUD 1 each  3. Chronic hypertension complicating pregnancy, antepartum BP controlled off meds  4. Postpartum depression Controlled now off meds.    Plan:   Essential components of care per ACOG recommendations:  1.  Mood and well being: Patient with negative depression screening today. Reviewed local resources for support.  - Patient tobacco use? No.   - hx of drug use? No.    2. Infant care and feeding:  -Patient currently breastmilk feeding? No.  -Social determinants of health (SDOH) reviewed in EPIC. No concerns  3. Sexuality, contraception and birth spacing - Patient does not want a pregnancy in the next year.   - Reviewed reproductive life planning. Reviewed contraceptive methods based on pt preferences and effectiveness.   Patient desired IUD or IUS today.   - Discussed birth spacing of 18 months  4. Sleep and fatigue -Encouraged family/partner/community support of 4 hrs of uninterrupted sleep to help with mood and fatigue  5. Physical Recovery  - Discussed patients delivery and complications. She describes her labor as good. - Patient had a Vaginal, no problems at delivery. Patient had a 1st degree laceration. Perineal healing reviewed. Patient expressed understanding - Patient has urinary incontinence? No. - Patient is safe to resume physical and sexual activity  6.  Health Maintenance - HM due items addressed Yes - Last pap smear  Diagnosis  Date Value Ref Range Status  08/01/2021   Final   - Negative for intraepithelial lesion or malignancy (NILM)   Pap smear not done at today's visit.  -Breast Cancer screening indicated? No.   7. Chronic Disease/Pregnancy Condition follow up: Hypertension  - PCP follow up  [place] for [place]

## 2022-04-04 DIAGNOSIS — Z419 Encounter for procedure for purposes other than remedying health state, unspecified: Secondary | ICD-10-CM | POA: Diagnosis not present

## 2022-04-30 ENCOUNTER — Ambulatory Visit: Payer: Medicaid Other | Admitting: Family Medicine

## 2022-05-05 DIAGNOSIS — Z419 Encounter for procedure for purposes other than remedying health state, unspecified: Secondary | ICD-10-CM | POA: Diagnosis not present

## 2022-06-04 DIAGNOSIS — Z419 Encounter for procedure for purposes other than remedying health state, unspecified: Secondary | ICD-10-CM | POA: Diagnosis not present

## 2022-07-05 DIAGNOSIS — Z419 Encounter for procedure for purposes other than remedying health state, unspecified: Secondary | ICD-10-CM | POA: Diagnosis not present

## 2022-07-23 ENCOUNTER — Telehealth: Payer: Self-pay

## 2022-08-04 DIAGNOSIS — Z419 Encounter for procedure for purposes other than remedying health state, unspecified: Secondary | ICD-10-CM | POA: Diagnosis not present

## 2022-09-04 DIAGNOSIS — Z419 Encounter for procedure for purposes other than remedying health state, unspecified: Secondary | ICD-10-CM | POA: Diagnosis not present

## 2022-09-26 NOTE — Telephone Encounter (Signed)
Erroneous entry

## 2022-10-05 DIAGNOSIS — Z419 Encounter for procedure for purposes other than remedying health state, unspecified: Secondary | ICD-10-CM | POA: Diagnosis not present

## 2022-11-04 DIAGNOSIS — Z419 Encounter for procedure for purposes other than remedying health state, unspecified: Secondary | ICD-10-CM | POA: Diagnosis not present

## 2022-12-05 DIAGNOSIS — Z419 Encounter for procedure for purposes other than remedying health state, unspecified: Secondary | ICD-10-CM | POA: Diagnosis not present

## 2023-01-04 DIAGNOSIS — Z419 Encounter for procedure for purposes other than remedying health state, unspecified: Secondary | ICD-10-CM | POA: Diagnosis not present

## 2023-02-04 DIAGNOSIS — Z419 Encounter for procedure for purposes other than remedying health state, unspecified: Secondary | ICD-10-CM | POA: Diagnosis not present

## 2023-03-07 DIAGNOSIS — Z419 Encounter for procedure for purposes other than remedying health state, unspecified: Secondary | ICD-10-CM | POA: Diagnosis not present

## 2023-03-14 ENCOUNTER — Encounter (HOSPITAL_BASED_OUTPATIENT_CLINIC_OR_DEPARTMENT_OTHER): Payer: Self-pay | Admitting: Emergency Medicine

## 2023-03-14 ENCOUNTER — Emergency Department (HOSPITAL_BASED_OUTPATIENT_CLINIC_OR_DEPARTMENT_OTHER): Payer: Medicaid Other

## 2023-03-14 ENCOUNTER — Emergency Department (HOSPITAL_BASED_OUTPATIENT_CLINIC_OR_DEPARTMENT_OTHER)
Admission: EM | Admit: 2023-03-14 | Discharge: 2023-03-14 | Disposition: A | Discharge status: Home / Self Care | Payer: Medicaid Other | Attending: Emergency Medicine | Admitting: Emergency Medicine

## 2023-03-14 DIAGNOSIS — Z20822 Contact with and (suspected) exposure to covid-19: Secondary | ICD-10-CM | POA: Diagnosis not present

## 2023-03-14 DIAGNOSIS — I10 Essential (primary) hypertension: Secondary | ICD-10-CM | POA: Diagnosis not present

## 2023-03-14 DIAGNOSIS — J101 Influenza due to other identified influenza virus with other respiratory manifestations: Secondary | ICD-10-CM | POA: Insufficient documentation

## 2023-03-14 DIAGNOSIS — Z79899 Other long term (current) drug therapy: Secondary | ICD-10-CM | POA: Diagnosis not present

## 2023-03-14 DIAGNOSIS — R059 Cough, unspecified: Secondary | ICD-10-CM | POA: Diagnosis not present

## 2023-03-14 DIAGNOSIS — R42 Dizziness and giddiness: Secondary | ICD-10-CM | POA: Diagnosis not present

## 2023-03-14 DIAGNOSIS — R531 Weakness: Secondary | ICD-10-CM | POA: Diagnosis not present

## 2023-03-14 LAB — CBC WITH DIFFERENTIAL/PLATELET
Abs Immature Granulocytes: 0.01 10*3/uL (ref 0.00–0.07)
Basophils Absolute: 0 10*3/uL (ref 0.0–0.1)
Basophils Relative: 0 %
Eosinophils Absolute: 0 10*3/uL (ref 0.0–0.5)
Eosinophils Relative: 0 %
HCT: 40.8 % (ref 36.0–46.0)
Hemoglobin: 13.9 g/dL (ref 12.0–15.0)
Immature Granulocytes: 0 %
Lymphocytes Relative: 22 %
Lymphs Abs: 1 10*3/uL (ref 0.7–4.0)
MCH: 29.8 pg (ref 26.0–34.0)
MCHC: 34.1 g/dL (ref 30.0–36.0)
MCV: 87.4 fL (ref 80.0–100.0)
Monocytes Absolute: 0.2 10*3/uL (ref 0.1–1.0)
Monocytes Relative: 5 %
Neutro Abs: 3.2 10*3/uL (ref 1.7–7.7)
Neutrophils Relative %: 73 %
Platelets: 112 10*3/uL — ABNORMAL LOW (ref 150–400)
RBC: 4.67 MIL/uL (ref 3.87–5.11)
RDW: 12.4 % (ref 11.5–15.5)
WBC: 4.5 10*3/uL (ref 4.0–10.5)
nRBC: 0 % (ref 0.0–0.2)

## 2023-03-14 LAB — BASIC METABOLIC PANEL
Anion gap: 8 (ref 5–15)
BUN: 8 mg/dL (ref 6–20)
CO2: 28 mmol/L (ref 22–32)
Calcium: 8.3 mg/dL — ABNORMAL LOW (ref 8.9–10.3)
Chloride: 103 mmol/L (ref 98–111)
Creatinine, Ser: 0.86 mg/dL (ref 0.44–1.00)
GFR, Estimated: >60 mL/min (ref >60)
Glucose, Bld: 101 mg/dL — ABNORMAL HIGH (ref 70–99)
Potassium: 3.3 mmol/L — ABNORMAL LOW (ref 3.5–5.1)
Sodium: 139 mmol/L (ref 135–145)

## 2023-03-14 LAB — RESP PANEL BY RT-PCR (RSV, FLU A&B, COVID)  RVPGX2
Influenza A by PCR: POSITIVE — AB
Influenza B by PCR: NEGATIVE
Resp Syncytial Virus by PCR: NEGATIVE
SARS Coronavirus 2 by RT PCR: NEGATIVE

## 2023-03-14 LAB — GROUP A STREP BY PCR: Group A Strep by PCR: NOT DETECTED

## 2023-03-14 MED ORDER — ONDANSETRON 4 MG PO TBDP: 4.0000 mg | ORAL_TABLET | Freq: Three times a day (TID) | ORAL | 0 refills | Status: AC | PRN

## 2023-03-14 MED ORDER — ONDANSETRON HCL 4 MG/2ML IJ SOLN
4.0000 mg | Freq: Once | INTRAMUSCULAR | Status: AC
  Administered 2023-03-14: 4 mg via INTRAVENOUS
  Filled 2023-03-14: qty 2

## 2023-03-14 MED ORDER — SODIUM CHLORIDE 0.9 % IV BOLUS
1000.0000 mL | Freq: Once | INTRAVENOUS | Status: AC
  Administered 2023-03-14: 1000 mL via INTRAVENOUS

## 2023-03-14 MED ORDER — ACETAMINOPHEN 500 MG PO TABS
1000.0000 mg | ORAL_TABLET | Freq: Once | ORAL | Status: AC
  Administered 2023-03-14: 1000 mg via ORAL
  Filled 2023-03-14: qty 2

## 2023-03-14 NOTE — ED Provider Notes (Signed)
 [facility] EMERGENCY DEPARTMENT AT [facility] Provider Note   CSN: [id number] Arrival date & time: 03/14/23  0700     History  Chief Complaint  Patient presents with   Cough    Margaret Ryan is a 38 y.o. female.  Here with flulike symptoms for the last few days.  Family member sick with the same.  Cough nausea.  Some chest discomfort with the cough.  Nothing makes it better or worse.  Has had sore throat.  No abdominal pain no diarrhea.  History of hypertension.  The history is provided by the patient.       Home Medications Prior to Admission medications   Medication Sig Start Date End Date Taking? Authorizing Provider  ondansetron  (ZOFRAN -ODT) 4 MG disintegrating tablet Take 1 tablet (4 mg total) by mouth every 8 (eight) hours as needed. 03/14/23  Yes Anyelina Claycomb, DO  acetaminophen  (TYLENOL ) 500 MG tablet Take 1,000 mg by mouth at bedtime as needed for headache, moderate pain or mild pain. Patient not taking: Reported on 03/27/2022    [provider]  Docosahexaenoic Acid (DHA OMEGA 3 PO) Take 1 capsule by mouth at bedtime.    [provider]  escitalopram  (LEXAPRO ) 10 MG tablet Take 1 tablet (10 mg total) by mouth daily. Patient not taking: Reported on 03/27/2022 02/12/22   Stinson, Jacob J, DO  furosemide  (LASIX ) 20 MG tablet Take 1 tablet (20 mg total) by mouth 2 (two) times daily. Patient not taking: Reported on 03/27/2022 02/12/22   Stinson, Jacob J, DO  ibuprofen  (ADVIL ) 200 MG tablet Take 200 mg by mouth every 6 (six) hours as needed. Patient not taking: Reported on 03/27/2022    [provider]  NIFEdipine  (PROCARDIA -XL/NIFEDICAL-XL) 30 MG 24 hr tablet Take 1 tablet (30 mg total) by mouth daily. Can increase to twice a day as needed for symptomatic contractions Patient not taking: Reported on 03/27/2022 02/12/22   Stinson, Jacob J, DO  Prenatal Vit-Fe Fumarate-FA (PRENATAL VITAMIN PO) Take 1 tablet by mouth at bedtime.    [provider]      Allergies    Patient has no known allergies.    Review of Systems   Review of Systems  Physical Exam Updated Vital Signs BP (!) 168/96   Pulse 85   Temp (!) 102.3 F (39.1 C) (Oral)   Resp 20   SpO2 98%  Physical Exam Vitals and nursing note reviewed.  Constitutional:      General: She is not in acute distress.    Appearance: She is well-developed.  HENT:     Head: Normocephalic and atraumatic.     Nose: Nose normal.     Mouth/Throat:     Mouth: Mucous membranes are moist.  Eyes:     Extraocular Movements: Extraocular movements intact.     Conjunctiva/sclera: Conjunctivae normal.     Pupils: Pupils are equal, round, and reactive to light.  Cardiovascular:     Rate and Rhythm: Normal rate and regular rhythm.     Pulses: Normal pulses.     Heart sounds: No murmur heard. Pulmonary:     Effort: Pulmonary effort is normal. No respiratory distress.     Breath sounds: Normal breath sounds.  Abdominal:     General: Abdomen is flat.     Palpations: Abdomen is soft.     Tenderness: There is no abdominal tenderness.  Musculoskeletal:        General: No swelling.     Cervical  back: Normal range of motion and neck supple.  Skin:    General: Skin is warm and dry.     Capillary Refill: Capillary refill takes less than 2 seconds.  Neurological:     General: No focal deficit present.     Mental Status: She is alert.  Psychiatric:        Mood and Affect: Mood normal.     ED Results / Procedures / Treatments   Labs (all labs ordered are listed, but only abnormal results are displayed) Labs Reviewed  RESP PANEL BY RT-PCR (RSV, FLU A&B, COVID)  RVPGX2 - Abnormal; Notable for the following components:      Result Value   Influenza A by PCR POSITIVE (*)    All other components within normal limits  CBC WITH DIFFERENTIAL/PLATELET - Abnormal; Notable for the following components:   Platelets 112 (*)    All other components within normal limits  BASIC  METABOLIC PANEL - Abnormal; Notable for the following components:   Potassium 3.3 (*)    Glucose, Bld 101 (*)    Calcium 8.3 (*)    All other components within normal limits  GROUP A STREP BY PCR    EKG None  Radiology DG Chest Portable 1 View Result Date: 03/14/2023 CLINICAL DATA:  Cough and dizziness.  Weakness. EXAM: PORTABLE CHEST 1 VIEW COMPARISON:  No comparison studies available. FINDINGS: The heart size and mediastinal contours are within normal limits. Both lungs are clear. The visualized skeletal structures are unremarkable. IMPRESSION: No active disease. Electronically Signed   By: Camellia Candle M.D.   On: 03/14/2023 07:42    Procedures Procedures    Medications Ordered in ED Medications  ondansetron  (ZOFRAN ) injection 4 mg (4 mg Intravenous Given 03/14/23 0745)  sodium chloride  0.9 % bolus 1,000 mL (1,000 mLs Intravenous New Bag/Given 03/14/23 0743)  acetaminophen  (TYLENOL ) tablet 1,000 mg (1,000 mg Oral Given 03/14/23 0745)    ED Course/ Medical Decision Making/ A&P                                 Medical Decision Making Amount and/or Complexity of Data Reviewed Labs: ordered. Radiology: ordered.  Risk OTC drugs. Prescription drug management.   Margaret Ryan is here with flulike symptoms.  Patient febrile but otherwise unremarkable vitals.  She has had nausea poor p.o. intake.  She has not vomited.  Differential diagnosis strep throat versus viral process.  Will check basic labs to look for electrolyte abnormality, dehydration, get x-ray to look for pneumonia get strep and flu swab.  Will give IV fluids IV Zofran  and reevaluate.  Per my review interpretation of labs positive for influenza A.  No pneumonia on chest x-ray.  Lab work otherwise unremarkable.  She is feeling better following Tylenol  and Zofran .  Given fluids.  Recommend to care at home with Tylenol  and ibuprofen .  Discharged in good condition.  This chart was dictated using voice recognition  software.  Despite best efforts to proofread,  errors can occur which can change the documentation meaning.         Final Clinical Impression(s) / ED Diagnoses Final diagnoses:  Influenza A    Rx / DC Orders ED Discharge Orders          Ordered    ondansetron  (ZOFRAN -ODT) 4 MG disintegrating tablet  Every 8 hours PRN        03/14/23 9157  Ruthe Cornet, DO 03/14/23 [phone]

## 2023-03-14 NOTE — Discharge Instructions (Addendum)
 Continue Tylenol  and ibuprofen  for fever.  1000 mg of Tylenol  every 6 hours as needed.  600 mg ibuprofen  every 8 hours as needed.  Take Zofran  as needed for nausea.

## 2023-03-14 NOTE — ED Triage Notes (Signed)
 Pt bib wheelchair with c/o cough, chest pain and nausea x 4 days. Endorses fever intermittently. Hoarseness noted

## 2023-03-14 NOTE — ED Notes (Signed)
 ED Provider at bedside.

## 2023-04-04 DIAGNOSIS — Z419 Encounter for procedure for purposes other than remedying health state, unspecified: Secondary | ICD-10-CM | POA: Diagnosis not present

## 2023-05-16 DIAGNOSIS — Z419 Encounter for procedure for purposes other than remedying health state, unspecified: Secondary | ICD-10-CM | POA: Diagnosis not present

## 2023-06-15 DIAGNOSIS — Z419 Encounter for procedure for purposes other than remedying health state, unspecified: Secondary | ICD-10-CM | POA: Diagnosis not present

## 2023-07-16 DIAGNOSIS — Z419 Encounter for procedure for purposes other than remedying health state, unspecified: Secondary | ICD-10-CM | POA: Diagnosis not present

## 2023-08-15 DIAGNOSIS — Z419 Encounter for procedure for purposes other than remedying health state, unspecified: Secondary | ICD-10-CM | POA: Diagnosis not present

## 2023-09-15 DIAGNOSIS — Z419 Encounter for procedure for purposes other than remedying health state, unspecified: Secondary | ICD-10-CM | POA: Diagnosis not present

## 2023-10-16 DIAGNOSIS — Z419 Encounter for procedure for purposes other than remedying health state, unspecified: Secondary | ICD-10-CM | POA: Diagnosis not present
